# Patient Record
Sex: Male | Born: 1995
Health system: Southern US, Community
[De-identification: ages and names within clinical notes are randomized; demographics above are authoritative.]

## PROBLEM LIST (undated history)

## (undated) DIAGNOSIS — N289 Disorder of kidney and ureter, unspecified: Secondary | ICD-10-CM

---
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
## 2013-02-18 ENCOUNTER — Emergency Department (HOSPITAL_COMMUNITY)
Admission: EM | Admit: 2013-02-18 | Discharge: 2013-02-18 | Disposition: A | Discharge status: Home / Self Care | Payer: Self-pay | Attending: Emergency Medicine | Admitting: Emergency Medicine

## 2013-02-18 ENCOUNTER — Encounter (HOSPITAL_COMMUNITY): Payer: Self-pay | Admitting: Emergency Medicine

## 2013-02-18 DIAGNOSIS — R112 Nausea with vomiting, unspecified: Secondary | ICD-10-CM | POA: Insufficient documentation

## 2013-02-18 DIAGNOSIS — N200 Calculus of kidney: Secondary | ICD-10-CM | POA: Insufficient documentation

## 2013-02-18 HISTORY — DX: Disorder of kidney and ureter, unspecified: N28.9

## 2013-02-18 LAB — URINALYSIS, ROUTINE W REFLEX MICROSCOPIC
Glucose, UA: NEGATIVE mg/dL
Nitrite: NEGATIVE
Specific Gravity, Urine: 1.025 (ref 1.005–1.030)
pH: 6.5 (ref 5.0–8.0)

## 2013-02-18 LAB — CBC WITH DIFFERENTIAL/PLATELET
Eosinophils Relative: 0 % (ref 0–5)
Hemoglobin: 15.4 g/dL (ref 13.0–17.0)
Lymphocytes Relative: 6 % — ABNORMAL LOW (ref 12–46)
Lymphs Abs: 0.9 10*3/uL (ref 0.7–4.0)
MCV: 89.3 fL (ref 78.0–100.0)
Monocytes Relative: 5 % (ref 3–12)
Neutrophils Relative %: 89 % — ABNORMAL HIGH (ref 43–77)
Platelets: 180 10*3/uL (ref 150–400)
RBC: 4.87 MIL/uL (ref 4.22–5.81)
WBC: 16.4 10*3/uL — ABNORMAL HIGH (ref 4.0–10.5)

## 2013-02-18 LAB — BASIC METABOLIC PANEL
CO2: 27 mEq/L (ref 19–32)
Glucose, Bld: 97 mg/dL (ref 70–99)
Potassium: 3.9 mEq/L (ref 3.5–5.1)
Sodium: 136 mEq/L (ref 135–145)

## 2013-02-18 MED ORDER — KETOROLAC TROMETHAMINE 30 MG/ML IJ SOLN
30.0000 mg | Freq: Once | INTRAMUSCULAR | Status: AC
  Administered 2013-02-18: 30 mg via INTRAVENOUS
  Filled 2013-02-18: qty 1

## 2013-02-18 MED ORDER — HYDROCODONE-ACETAMINOPHEN 5-325 MG PO TABS: 1.0000 | ORAL_TABLET | ORAL | Status: DC | PRN

## 2013-02-18 MED ORDER — ONDANSETRON HCL 4 MG/2ML IJ SOLN
4.0000 mg | Freq: Once | INTRAMUSCULAR | Status: AC
  Administered 2013-02-18: 4 mg via INTRAVENOUS
  Filled 2013-02-18: qty 2

## 2013-02-18 MED ORDER — HYDROMORPHONE HCL PF 1 MG/ML IJ SOLN
0.5000 mg | Freq: Once | INTRAMUSCULAR | Status: AC
  Administered 2013-02-18: 0.5 mg via INTRAVENOUS
  Filled 2013-02-18: qty 1

## 2013-02-18 MED ORDER — SODIUM CHLORIDE 0.9 % IV SOLN
INTRAVENOUS | Status: DC
  Administered 2013-02-18: 13:00:00 via INTRAVENOUS

## 2013-02-18 MED ORDER — PROMETHAZINE HCL 12.5 MG PO TABS: 12.5000 mg | ORAL_TABLET | Freq: Four times a day (QID) | ORAL | Status: DC | PRN

## 2013-02-18 NOTE — ED Provider Notes (Signed)
CSN: [id number]     Arrival date & time 02/18/13  1201 History   First MD Initiated Contact with Patient 02/18/13 1206     Chief Complaint  Patient presents with  . Flank Pain   (Consider location/radiation/quality/duration/timing/severity/associated sxs/prior Treatment) Patient is a 17 y.o. male presenting with flank pain. The history is provided by the patient.  Flank Pain This is a new problem. The current episode started yesterday. The problem occurs constantly. The problem has been gradually worsening. Associated symptoms include abdominal pain, nausea and vomiting. Pertinent negatives include no chest pain, chills, coughing, fever, headaches or rash. Nothing aggravates the symptoms. He has tried nothing for the symptoms.   NAVON KOTOWSKI is a 17 y.o. male who presents to the ED with right flank pain that started last night. History of kidney stones and this feels the same. He did have lithotripsy in the past x 1.  Last CT scan showed several stones bilateral. Pain at it's worst has been 9/10 and currently 7/10. He has taken nothing for pain.  Past Medical History  Diagnosis Date  . Renal disorder     kidney stones   History reviewed. No pertinent past surgical history. No family history on file. History  Substance Use Topics  . Smoking status: Never Smoker   . Smokeless tobacco: Not on file  . Alcohol Use: No    Review of Systems  Constitutional: Negative for fever and chills.  HENT: Negative.   Eyes: Negative for visual disturbance.  Respiratory: Negative for cough and shortness of breath.   Cardiovascular: Negative for chest pain.  Gastrointestinal: Positive for nausea, vomiting and abdominal pain.  Genitourinary: Positive for flank pain. Negative for dysuria and urgency.  Musculoskeletal: Positive for back pain.  Skin: Negative for rash.  Neurological: Negative for dizziness and headaches.  Psychiatric/Behavioral: The patient is not nervous/anxious.     Allergies   Sulfa antibiotics  Home Medications   Current Outpatient Rx  Name  Route  Sig  Dispense  Refill  . HYDROcodone-acetaminophen (NORCO/VICODIN) 5-325 MG per tablet   Oral   Take 1 tablet by mouth every 4 (four) hours as needed.   20 tablet   0   . promethazine (PHENERGAN) 12.5 MG tablet   Oral   Take 1 tablet (12.5 mg total) by mouth every 6 (six) hours as needed for nausea or vomiting.   30 tablet   0    BP 134/84  Pulse 72  Temp(Src) 97.5 F (36.4 C) (Oral)  Resp 18  Ht 5\' 5"  (1.651 m)  Wt 109 lb (49.442 kg)  BMI 18.14 kg/m2  SpO2 100% Physical Exam  Nursing note and vitals reviewed. Constitutional: He is oriented to person, place, and time. He appears well-developed and well-nourished.  HENT:  Head: Normocephalic and atraumatic.  Eyes: EOM are normal.  Neck: Neck supple.  Cardiovascular: Normal rate and regular rhythm.   Pulmonary/Chest: Effort normal and breath sounds normal.  Abdominal: Soft. There is tenderness in the right lower quadrant. There is no rigidity, no rebound and no guarding.  Right flank pain with palpation.  Musculoskeletal: Normal range of motion.  Neurological: He is alert and oriented to person, place, and time. No cranial nerve deficit.  Skin: Skin is warm and dry.  Psychiatric: He has a normal mood and affect. His behavior is normal.   Results for orders placed during the hospital encounter of 02/18/13 (from the past 24 hour(s))  URINALYSIS, ROUTINE W REFLEX MICROSCOPIC  Status: Abnormal   Collection Time    02/18/13 12:22 PM      Result Value Range   Color, Urine AMBER (*) YELLOW   APPearance HAZY (*) CLEAR   Specific Gravity, Urine 1.025  1.005 - 1.030   pH 6.5  5.0 - 8.0   Glucose, UA NEGATIVE  NEGATIVE mg/dL   Hgb urine dipstick LARGE (*) NEGATIVE   Bilirubin Urine SMALL (*) NEGATIVE   Ketones, ur NEGATIVE  NEGATIVE mg/dL   Protein, ur 308 (*) NEGATIVE mg/dL   Urobilinogen, UA 0.2  0.0 - 1.0 mg/dL   Nitrite NEGATIVE  NEGATIVE    Leukocytes, UA NEGATIVE  NEGATIVE  URINE MICROSCOPIC-ADD ON     Status: None   Collection Time    02/18/13 12:22 PM      Result Value Range   RBC / HPF TOO NUMEROUS TO COUNT  <3 RBC/hpf  CBC WITH DIFFERENTIAL     Status: Abnormal   Collection Time    02/18/13  1:34 PM      Result Value Range   WBC 16.4 (*) 4.0 - 10.5 K/uL   RBC 4.87  4.22 - 5.81 MIL/uL   Hemoglobin 15.4  13.0 - 17.0 g/dL   HCT 65.7  84.6 - 96.2 %   MCV 89.3  78.0 - 100.0 fL   MCH 31.6  26.0 - 34.0 pg   MCHC 35.4  30.0 - 36.0 g/dL   RDW 95.2  84.1 - 32.4 %   Platelets 180  150 - 400 K/uL   Neutrophils Relative % 89 (*) 43 - 77 %   Neutro Abs 14.5 (*) 1.7 - 7.7 K/uL   Lymphocytes Relative 6 (*) 12 - 46 %   Lymphs Abs 0.9  0.7 - 4.0 K/uL   Monocytes Relative 5  3 - 12 %   Monocytes Absolute 0.9  0.1 - 1.0 K/uL   Eosinophils Relative 0  0 - 5 %   Eosinophils Absolute 0.0  0.0 - 0.7 K/uL   Basophils Relative 0  0 - 1 %   Basophils Absolute 0.0  0.0 - 0.1 K/uL  BASIC METABOLIC PANEL     Status: None   Collection Time    02/18/13  1:34 PM      Result Value Range   Sodium 136  135 - 145 mEq/L   Potassium 3.9  3.5 - 5.1 mEq/L   Chloride 100  96 - 112 mEq/L   CO2 27  19 - 32 mEq/L   Glucose, Bld 97  70 - 99 mg/dL   BUN 9  6 - 23 mg/dL   Creatinine, Ser 4.01  0.50 - 1.35 mg/dL   Calcium 02.7  8.4 - 25.3 mg/dL   GFR calc non Af Amer >90  >90 mL/min   GFR calc Af Amer >90  >90 mL/min    ED Course  Procedures Re examined after IV hydration, Zofran and Toradol. Patient feeling better pain 4/10, no nausea. Abdomen soft, minimal tenderness RLQ and right flank.   MDM: Dr. Bebe Shaggy in to see the patient.   17 y.o. male with history of kidney stones with right flank pain, nausea and vomiting since last night. Will d/c home with urine strainer,medication for nausea and pain. Patient voided just prior to discharge and passed two stones at that time. Stable for discharge and will follow up with urology.  I have  reviewed this patient's vital signs, nurses notes, appropriate labs and discussed findings and plan of  care with the patient. He voices understanding.    Medication List         HYDROcodone-acetaminophen 5-325 MG per tablet  Commonly known as:  NORCO/VICODIN  Take 1 tablet by mouth every 4 (four) hours as needed.     promethazine 12.5 MG tablet  Commonly known as:  PHENERGAN  Take 1 tablet (12.5 mg total) by mouth every 6 (six) hours as needed for nausea or vomiting.           Janne Napoleon, NP 02/18/13 1415

## 2013-02-18 NOTE — ED Provider Notes (Signed)
Patient seen/examined in the Emergency Department in conjunction with Midlevel Provider neese Patient reports right flank pain Exam : awake/alert, no distress Plan: check labs, treat pain and likely stable for d/c with urology followup   Joya Gaskins, MD 02/18/13 1327

## 2013-02-18 NOTE — ED Provider Notes (Signed)
Medical screening examination/treatment/procedure(s) were conducted as a shared visit with non-physician practitioner(s) and myself.  I personally evaluated the patient during the encounter.  EKG Interpretation   None         Joya Gaskins, MD 02/18/13 1701

## 2013-02-18 NOTE — ED Notes (Signed)
Pt c/o pain in r flank since last night.  Reports pain and vomiting today.  Pt has history of kidney stones.

## 2013-05-27 ENCOUNTER — Emergency Department (HOSPITAL_COMMUNITY)
Admission: EM | Admit: 2013-05-27 | Discharge: 2013-05-27 | Disposition: A | Discharge status: Home / Self Care | Payer: Self-pay | Attending: Emergency Medicine | Admitting: Emergency Medicine

## 2013-05-27 ENCOUNTER — Encounter (HOSPITAL_COMMUNITY): Payer: Self-pay | Admitting: Emergency Medicine

## 2013-05-27 DIAGNOSIS — Z87442 Personal history of urinary calculi: Secondary | ICD-10-CM | POA: Insufficient documentation

## 2013-05-27 DIAGNOSIS — H6120 Impacted cerumen, unspecified ear: Secondary | ICD-10-CM

## 2013-05-27 DIAGNOSIS — J029 Acute pharyngitis, unspecified: Secondary | ICD-10-CM | POA: Insufficient documentation

## 2013-05-27 DIAGNOSIS — H6690 Otitis media, unspecified, unspecified ear: Secondary | ICD-10-CM

## 2013-05-27 MED ORDER — AMOXICILLIN 500 MG PO CAPS: 500.0000 mg | ORAL_CAPSULE | Freq: Three times a day (TID) | ORAL | Status: DC

## 2013-05-27 NOTE — ED Provider Notes (Signed)
Medical screening examination/treatment/procedure(s) were performed by non-physician practitioner and as supervising physician I was immediately available for consultation/collaboration.   EKG Interpretation None        , MD 05/27/13 1025 

## 2013-05-27 NOTE — Discharge Instructions (Signed)
Cerumen Impaction °A cerumen impaction is when the wax in your ear forms a plug. This plug usually causes reduced hearing. Sometimes it also causes an earache or dizziness. Removing a cerumen impaction can be difficult and painful. The wax sticks to the ear canal. The canal is sensitive and bleeds easily. If you try to remove a heavy wax buildup with a cotton tipped swab, you may push it in further. °Irrigation with water, suction, and small ear curettes may be used to clear out the wax. If the impaction is fixed to the skin in the ear canal, ear drops may be needed for a few days to loosen the wax. People who build up a lot of wax frequently can use ear wax removal products available in your local drugstore. °SEEK MEDICAL CARE IF:  °You develop an earache, increased hearing loss, or marked dizziness. °Document Released: 04/18/2004 Document Revised: 06/03/2011 Document Reviewed: 06/08/2009 °[employer]® Patient Information ©2014 [employer]. ° °Otitis Media, Adult °Otitis media is redness, soreness, and swelling (inflammation) of the middle ear. Otitis media may be caused by allergies or, most commonly, by infection. Often it occurs as a complication of the common cold. °SIGNS AND SYMPTOMS °Symptoms of otitis media may include: °· Earache. °· Fever. °· Ringing in your ear. °· Headache. °· Leakage of fluid from the ear. °DIAGNOSIS °To diagnose otitis media, your health care provider will examine your ear with an otoscope. This is an instrument that allows your health care provider to see into your ear in order to examine your eardrum. Your health care provider also will ask you questions about your symptoms. °TREATMENT  °Typically, otitis media resolves on its own within 3 5 days. Your health care provider may prescribe medicine to ease your symptoms of pain. If otitis media does not resolve within 5 days or is recurrent, your health care provider may prescribe antibiotic medicines if he or she suspects that a  bacterial infection is the cause. °HOME CARE INSTRUCTIONS  °· Take your medicine as directed until it is gone, even if you feel better after the first few days. °· Only take over-the-counter or prescription medicines for pain, discomfort, or fever as directed by your health care provider. °· Follow up with your health care provider as directed. °SEEK MEDICAL CARE IF: °· You have otitis media only in one ear or bleeding from your nose or both. °· You notice a lump on your neck. °· You are not getting better in 3 5 days. °· You feel worse instead of better. °SEEK IMMEDIATE MEDICAL CARE IF:  °· You have pain that is not controlled with medicine. °· You have swelling, redness, or pain around your ear or stiffness in your neck. °· You notice that part of your face is paralyzed. °· You notice that the bone behind your ear (mastoid) is tender when you touch it. °MAKE SURE YOU:  °· Understand these instructions. °· Will watch your condition. °· Will get help right away if you are not doing well or get worse. °Document Released: 12/15/2003 Document Revised: 12/30/2012 Document Reviewed: 10/06/2012 °[employer]® Patient Information ©2014 [employer]. ° °

## 2013-05-27 NOTE — ED Provider Notes (Signed)
CSN: [id number]     Arrival date & time 05/27/13  0751 History   First MD Initiated Contact with Patient 05/27/13 [phone]     Chief Complaint  Patient presents with  . Otalgia     (Consider location/radiation/quality/duration/timing/severity/associated sxs/prior Treatment) HPI Comments: Russell Reynolds is a 18 y.o. Male presenting with bilateral ear pressure since last night. He denies pain, stating he just has pressure, with the right being worse than the left and notices decreased hearing acuity.  There has been no drainage from the ears and he denies trauma.  He has had uri symptoms for the past week including nasal congestion and clear nasal drainage.  He denies sore throat and fever and denies any other complaints.  He has found no alleviators and nothing makes his symptoms worse.  The history is provided by the patient.    Past Medical History  Diagnosis Date  . Renal disorder     kidney stones   History reviewed. No pertinent past surgical history. No family history on file. History  Substance Use Topics  . Smoking status: Never Smoker   . Smokeless tobacco: Not on file  . Alcohol Use: No    Review of Systems  Constitutional: Negative for fever and chills.  HENT: Positive for congestion, hearing loss, rhinorrhea and sore throat. Negative for ear pain, sinus pressure, trouble swallowing and voice change.   Eyes: Negative for discharge.  Respiratory: Negative for cough, shortness of breath, wheezing and stridor.   Cardiovascular: Negative for chest pain.  Gastrointestinal: Negative for abdominal pain.  Genitourinary: Negative.       Allergies  Sulfa antibiotics  Home Medications   Current Outpatient Rx  Name  Route  Sig  Dispense  Refill  . ibuprofen (ADVIL,MOTRIN) 200 MG tablet   Oral   Take 400 mg by mouth every 6 (six) hours as needed for moderate pain.         Marland Kitchen. amoxicillin (AMOXIL) 500 MG capsule   Oral   Take 1 capsule (500 mg total) by mouth 3 (three)  times daily.   30 capsule   0    BP 140/93  Pulse 98  Temp(Src) 98.9 F (37.2 C) (Oral)  Resp 20  Ht 5\' 5"  (1.651 m)  Wt 110 lb (49.896 kg)  BMI 18.31 kg/m2  SpO2 99% Physical Exam  Constitutional: He is oriented to person, place, and time. He appears well-developed and well-nourished.  HENT:  Head: Normocephalic and atraumatic.  Right Ear: Ear canal normal. No drainage or tenderness. Decreased hearing is noted.  Left Ear: Tympanic membrane and ear canal normal. No tenderness. Tympanic membrane is not injected, not erythematous, not retracted and not bulging.  Nose: Mucosal edema and rhinorrhea present.  Mouth/Throat: Uvula is midline, oropharynx is clear and moist and mucous membranes are normal. No oropharyngeal exudate, posterior oropharyngeal edema, posterior oropharyngeal erythema or tonsillar abscesses.  Cerumen impaction right.  Eyes: Conjunctivae are normal.  Cardiovascular: Normal rate and normal heart sounds.   Pulmonary/Chest: Effort normal. No respiratory distress. He has no wheezes. He has no rales.  Abdominal: Soft. There is no tenderness.  Musculoskeletal: Normal range of motion.  Neurological: He is alert and oriented to person, place, and time.  Skin: Skin is warm and dry. No rash noted.  Psychiatric: He has a normal mood and affect.    ED Course  EAR CERUMEN REMOVAL Date/Time: 05/27/2013 9:00 AM Performed by: Burgess AmorIDOL,  Authorized by: Burgess AmorIDOL,  Consent: Verbal consent obtained. Risks and  benefits: risks, benefits and alternatives were discussed Consent given by: patient Patient identity confirmed: verbally with patient Local anesthetic: none Ceruminolytics applied: Ceruminolytics applied prior to the procedure. Location details: right ear Procedure type: curette and irrigation Comments: Copious dark wax flushed and curetted from the ear.  External canal with mild erythema on re-exam.  TM erythematous, loss of landmarks.   (including critical care  time)   Labs Review Labs Reviewed - No data to display Imaging Review No results found.   EKG Interpretation None      MDM   Final diagnoses:  Otitis media  Cerumen impaction    Hearing acuity normal at time of dc.  Amoxil prescribed for otitis media.  Encouraged avoiding using qtips which he currently uses to clean his ears. Prn f/u anticipated.    Burgess Amor, PA-C 05/27/13 [phone]

## 2013-05-27 NOTE — ED Notes (Signed)
Pt reports congestion for a week. Last night began to have pressure in bilateral ears. Denies fever.

## 2016-09-02 ENCOUNTER — Ambulatory Visit (INDEPENDENT_AMBULATORY_CARE_PROVIDER_SITE_OTHER): Payer: BLUE CROSS/BLUE SHIELD | Admitting: Otolaryngology

## 2016-09-02 DIAGNOSIS — H9011 Conductive hearing loss, unilateral, right ear, with unrestricted hearing on the contralateral side: Secondary | ICD-10-CM

## 2016-10-07 ENCOUNTER — Ambulatory Visit (INDEPENDENT_AMBULATORY_CARE_PROVIDER_SITE_OTHER): Payer: BLUE CROSS/BLUE SHIELD | Admitting: Otolaryngology

## 2016-10-07 DIAGNOSIS — H9011 Conductive hearing loss, unilateral, right ear, with unrestricted hearing on the contralateral side: Secondary | ICD-10-CM | POA: Diagnosis not present

## 2016-10-07 DIAGNOSIS — H6521 Chronic serous otitis media, right ear: Secondary | ICD-10-CM | POA: Diagnosis not present

## 2016-12-03 ENCOUNTER — Emergency Department (HOSPITAL_COMMUNITY)
Admission: EM | Admit: 2016-12-03 | Discharge: 2016-12-03 | Disposition: A | Discharge status: Home / Self Care | Payer: BLUE CROSS/BLUE SHIELD | Attending: Emergency Medicine | Admitting: Emergency Medicine

## 2016-12-03 ENCOUNTER — Encounter (HOSPITAL_COMMUNITY): Payer: Self-pay

## 2016-12-03 ENCOUNTER — Emergency Department (HOSPITAL_COMMUNITY): Payer: BLUE CROSS/BLUE SHIELD

## 2016-12-03 DIAGNOSIS — R310 Gross hematuria: Secondary | ICD-10-CM | POA: Insufficient documentation

## 2016-12-03 DIAGNOSIS — R319 Hematuria, unspecified: Secondary | ICD-10-CM | POA: Diagnosis present

## 2016-12-03 DIAGNOSIS — N200 Calculus of kidney: Secondary | ICD-10-CM | POA: Diagnosis not present

## 2016-12-03 LAB — URINALYSIS, ROUTINE W REFLEX MICROSCOPIC
Bacteria, UA: NONE SEEN
Bilirubin Urine: NEGATIVE
GLUCOSE, UA: NEGATIVE mg/dL
KETONES UR: NEGATIVE mg/dL
NITRITE: NEGATIVE
PH: 7 (ref 5.0–8.0)
PROTEIN: 100 mg/dL — AB
Specific Gravity, Urine: 1.014 (ref 1.005–1.030)
Squamous Epithelial / LPF: NONE SEEN

## 2016-12-03 LAB — BASIC METABOLIC PANEL
Anion gap: 9 (ref 5–15)
BUN: 13 mg/dL (ref 6–20)
CALCIUM: 9.2 mg/dL (ref 8.9–10.3)
CO2: 24 mmol/L (ref 22–32)
Chloride: 102 mmol/L (ref 101–111)
Creatinine, Ser: 0.76 mg/dL (ref 0.61–1.24)
GFR calc Af Amer: >60 mL/min (ref >60)
GLUCOSE: 92 mg/dL (ref 65–99)
Potassium: 3 mmol/L — ABNORMAL LOW (ref 3.5–5.1)
Sodium: 135 mmol/L (ref 135–145)

## 2016-12-03 LAB — CBC WITH DIFFERENTIAL/PLATELET
BASOS ABS: 0 10*3/uL (ref 0.0–0.1)
BASOS PCT: 0 %
EOS PCT: 1 %
Eosinophils Absolute: 0.1 10*3/uL (ref 0.0–0.7)
HEMATOCRIT: 43.1 % (ref 39.0–52.0)
Hemoglobin: 15.2 g/dL (ref 13.0–17.0)
LYMPHS PCT: 37 %
Lymphs Abs: 2.8 10*3/uL (ref 0.7–4.0)
MCH: 31.4 pg (ref 26.0–34.0)
MCHC: 35.3 g/dL (ref 30.0–36.0)
MCV: 89 fL (ref 78.0–100.0)
MONO ABS: 0.4 10*3/uL (ref 0.1–1.0)
MONOS PCT: 6 %
NEUTROS ABS: 4.2 10*3/uL (ref 1.7–7.7)
Neutrophils Relative %: 56 %
PLATELETS: 175 10*3/uL (ref 150–400)
RBC: 4.84 MIL/uL (ref 4.22–5.81)
RDW: 12.3 % (ref 11.5–15.5)
WBC: 7.6 10*3/uL (ref 4.0–10.5)

## 2016-12-03 LAB — CK: Total CK: 87 U/L (ref 49–397)

## 2016-12-03 MED ORDER — SODIUM CHLORIDE 0.9 % IV BOLUS (SEPSIS)
1000.0000 mL | Freq: Once | INTRAVENOUS | Status: AC
  Administered 2016-12-03: 1000 mL via INTRAVENOUS

## 2016-12-03 MED ORDER — ONDANSETRON 4 MG PO TBDP: 4.0000 mg | ORAL_TABLET | Freq: Three times a day (TID) | ORAL | 0 refills | Status: DC | PRN

## 2016-12-03 MED ORDER — IBUPROFEN 800 MG PO TABS: 800.0000 mg | ORAL_TABLET | Freq: Three times a day (TID) | ORAL | 0 refills | Status: DC | PRN

## 2016-12-03 NOTE — ED Triage Notes (Signed)
Reports of blood in urine approx. 30 minutes ago. Has no other complaints.

## 2016-12-03 NOTE — ED Provider Notes (Signed)
Emergency Department Provider Note   I have reviewed the triage vital signs and the nursing notes.   HISTORY  Chief Complaint Hematuria   HPI [name] is a 21 y.o. male with PMH of kidney stones presents emergency department for evaluation of sudden onset, asymptomatic hematuria. Symptoms began approximately one hour prior to ED presentation. He had the urge to urinate and then noticed dark red discoloration of the urine. He denies any associated urethral pain or discharge. Denies flank pain or abdominal discomfort. His only medication is Paxil which she is been on for a year with no complications. 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 He drinks water regularly. He does have a past history of complicated kidney stones but almost always has pain with these episodes. No vomiting or diarrhea. No chest pain or difficulty breathing.  Past Medical History:  Diagnosis Date  . Renal disorder    kidney stones    There are no active problems to display for this patient.   History reviewed. No pertinent surgical history.  Current Outpatient Rx  . Order #: [id number] Class: Print  . Order #: [id number] Class: Print  . Order #: [id number] Class: Historical Med    Allergies Sulfa antibiotics  No family history on file.  Social History Social History  Substance Use Topics  . Smoking status: Never Smoker  . Smokeless tobacco: Never Used  . Alcohol use No    Review of Systems  Constitutional: No fever/chills Eyes: No visual changes. ENT: No sore throat. Cardiovascular: Denies chest pain. Respiratory: Denies shortness of breath. Gastrointestinal: No abdominal pain.  No nausea, no vomiting.  No diarrhea.  No constipation. Genitourinary: Negative for dysuria. Positive hematuria.  Musculoskeletal: Negative for back pain. Skin: Negative for rash. Neurological: Negative for headaches, focal weakness or numbness.  10-point ROS  otherwise negative.  ____________________________________________   PHYSICAL EXAM:  VITAL SIGNS: ED Triage Vitals  Enc Vitals Group     BP 12/03/16 1953 (!) 141/96     Pulse Rate 12/03/16 1953 80     Resp 12/03/16 1953 18     Temp 12/03/16 1953 98.3 F (36.8 C)     Temp Source 12/03/16 1953 Oral     SpO2 12/03/16 1953 100 %     Weight 12/03/16 1954 120 lb (54.4 kg)     Height 12/03/16 1954 5\' 4"  (1.626 m)     Pain Score 12/03/16 1951 0   Constitutional: Alert and oriented. Well appearing and in no acute distress. Eyes: Conjunctivae are normal.  Head: Atraumatic. Nose: No congestion/rhinnorhea. Mouth/Throat: Mucous membranes are moist.  Oropharynx non-erythematous. Neck: No stridor.   Cardiovascular: Normal rate, regular rhythm. Good peripheral circulation. Grossly normal heart sounds.   Respiratory: Normal respiratory effort.  No retractions. Lungs CTAB. Gastrointestinal: Soft and nontender. No distention.  Musculoskeletal: No lower extremity tenderness nor edema. No gross deformities of extremities. Neurologic:  Normal speech and language. No gross focal neurologic deficits are appreciated.  Skin:  Skin is warm, dry and intact. No rash noted.  ____________________________________________   LABS (all labs ordered are listed, but only abnormal results are displayed)  Labs Reviewed  URINALYSIS, ROUTINE W REFLEX MICROSCOPIC - Abnormal; Notable for the following:       Result Value   Color, Urine AMBER (*)    APPearance CLOUDY (*)    Hgb urine dipstick LARGE (*)    Protein, ur 100 (*)    Leukocytes, UA SMALL (*)  All other components within normal limits  BASIC METABOLIC PANEL - Abnormal; Notable for the following:    Potassium 3.0 (*)    All other components within normal limits  CBC WITH DIFFERENTIAL/PLATELET  CK   ____________________________________________  RADIOLOGY  Ct Renal Stone Study  Result Date: 12/03/2016 CLINICAL DATA:  Initial evaluation for  acute hematuria. EXAM: CT ABDOMEN AND PELVIS WITHOUT CONTRAST TECHNIQUE: Multidetector CT imaging of the abdomen and pelvis was performed following the standard protocol without IV contrast. COMPARISON:  Prior CT from 08/19/2007. FINDINGS: Lower chest: Visualized lung bases are clear. Hepatobiliary: Limited noncontrast evaluation of the liver is unremarkable. Gallbladder within normal limits. No biliary dilatation. Pancreas: Pancreas within normal limits. Spleen: Spleen within normal limits. Adrenals/Urinary Tract: Adrenal glands are normal. Kidneys equal in size. Scattered cyst present bilaterally, largest of which position within the interpolar right kidney and measures 3.6 cm. Bilateral nonobstructive nephrolithiasis. Largest on the right positioned within the lower pole and measures 16 mm. Largest on the left also present in the lower pole and measures 4 mm. No radiopaque calculi seen along the expected course of the right renal collecting system. No right-sided hydronephrosis or hydroureter. On the left, there is a 3 mm stone within the mid left ureter ear with mild left hydroureter without significant hydronephrosis. No other calculi seen within the left ureter. There is a 5 mm stone positioned within the left posterior bladder lumen just beyond the left UVJ. Bladder otherwise unremarkable. Stomach/Bowel: Stomach within normal limits. No evidence for bowel obstruction. Appendix normal. No acute inflammatory changes seen about the bowels. Vascular/Lymphatic: Intra-abdominal aorta of normal caliber. No adenopathy. Reproductive: Prostate within normal limits. Other: No free air or fluid. Musculoskeletal: No acute osseus abnormality. No worrisome lytic or blastic osseous lesions. IMPRESSION: 1. 3 mm stone lodged within the mid left ureter with secondary mild left hydroureter without significant hydronephrosis. 2. Additional 5 mm stone positioned within the left posterior bladder lumen just beyond the left UVJ. 3.  Additional bilateral nonobstructive nephrolithiasis as above. 4. No other acute intra-abdominal or pelvic process. Electronically Signed   By: Rise Mu M.D.   On: 12/03/2016 22:41    ____________________________________________   PROCEDURES  Procedure(s) performed:   Procedures  None ____________________________________________   INITIAL IMPRESSION / ASSESSMENT AND PLAN / ED COURSE  Pertinent labs & imaging results that were available during my care of the patient were reviewed by me and considered in my medical decision making (see chart for details).  Patient resents to the emergency department for evaluation of asymptomatic hematuria which began immediately prior to ED presentation. He does have a history complicated by prior kidney stones. No fevers or chills. No pain. Plan for labs, CK, IV fluids, and CT renal protocol for further evaluation.   10:50 PM Patient with two two kidney stones on CT. One 3 mm stone in the left ureter and a second 5 mm stone in the bladder. No other significant findings. Patient with anaphylaxis reaction with Sulfa so did not prescribe Flomax. Will f/u with Urology.   At this time, I do not feel there is any life-threatening condition present. I have reviewed and discussed all results (EKG, imaging, lab, urine as appropriate), exam findings with patient. I have reviewed nursing notes and appropriate previous records.  I feel the patient is safe to be discharged home without further emergent workup. Discussed usual and customary return precautions. Patient and family (if present) verbalize understanding and are comfortable with this plan.  Patient will  follow-up with their primary care provider. If they do not have a primary care provider, information for follow-up has been provided to them. All questions have been answered.  ____________________________________________  FINAL CLINICAL IMPRESSION(S) / ED DIAGNOSES  Final diagnoses:  Gross  hematuria  Kidney stones     MEDICATIONS GIVEN DURING THIS VISIT:  Medications  sodium chloride 0.9 % bolus 1,000 mL (0 mLs Intravenous Stopped 12/03/16 2251)     NEW OUTPATIENT MEDICATIONS STARTED DURING THIS VISIT:  Discharge Medication List as of 12/03/2016 10:58 PM    START taking these medications   Details  ibuprofen (ADVIL,MOTRIN) 800 MG tablet Take 1 tablet (800 mg total) by mouth every 8 (eight) hours as needed for moderate pain., Starting Tue 12/03/2016, Print    ondansetron (ZOFRAN ODT) 4 MG disintegrating tablet Take 1 tablet (4 mg total) by mouth every 8 (eight) hours as needed for nausea or vomiting., Starting Tue 12/03/2016, Print        Note:  This document was prepared using Dragon voice recognition software and may include unintentional dictation errors.  Alona Bene, MD Emergency Medicine    , Arlyss Repress, MD 12/04/16 [phone]

## 2016-12-03 NOTE — Discharge Instructions (Signed)
You have been seen in the Emergency Department (ED) today for pain that we believe based on your workup, is caused by kidney stones.  As we have discussed, please drink plenty of fluids.  Please make a follow up appointment with the physician(s) listed elsewhere in this documentation.  You may take pain medication as needed but ONLY as prescribed. We also recommend that you take over-the-counter ibuprofen regularly according to label instructions over the next 5 days.  Take it with meals to minimize stomach discomfort.  Please see your doctor as soon as possible as stones may take 1-3 weeks to pass and you may require additional care or medications.  Return to the Emergency Department (ED) or call your doctor if you have any worsening pain, fever, painful urination, are unable to urinate, or develop other symptoms that concern you.    Kidney Stones Kidney stones (urolithiasis) are deposits that form inside your kidneys. The intense pain is caused by the stone moving through the urinary tract. When the stone moves, the ureter goes into spasm around the stone. The stone is usually passed in the urine.  CAUSES  A disorder that makes certain neck glands produce too much parathyroid hormone (primary hyperparathyroidism). A buildup of uric acid crystals, similar to gout in your joints. Narrowing (stricture) of the ureter. A kidney obstruction present at birth (congenital obstruction). Previous surgery on the kidney or ureters. Numerous kidney infections. SYMPTOMS  Feeling sick to your stomach (nauseous). Throwing up (vomiting). Blood in the urine (hematuria). Pain that usually spreads (radiates) to the groin. Frequency or urgency of urination. DIAGNOSIS  Taking a history and physical exam. Blood or urine tests. CT scan. Occasionally, an examination of the inside of the urinary bladder (cystoscopy) is performed. TREATMENT  Observation. Increasing your fluid intake. Extracorporeal shock wave  lithotripsy--This is a noninvasive procedure that uses shock waves to break up kidney stones. Surgery may be needed if you have severe pain or persistent obstruction. There are various surgical procedures. Most of the procedures are performed with the use of small instruments. Only small incisions are needed to accommodate these instruments, so recovery time is minimized. The size, location, and chemical composition are all important variables that will determine the proper choice of action for you. Talk to your health care provider to better understand your situation so that you will minimize the risk of injury to yourself and your kidney.  HOME CARE INSTRUCTIONS  Drink enough water and fluids to keep your urine clear or pale yellow. This will help you to pass the stone or stone fragments. Strain all urine through the provided strainer. Keep all particulate matter and stones for your health care provider to see. The stone causing the pain may be as small as a grain of salt. It is very important to use the strainer each and every time you pass your urine. The collection of your stone will allow your health care provider to analyze it and verify that a stone has actually passed. The stone analysis will often identify what you can do to reduce the incidence of recurrences. Only take over-the-counter or prescription medicines for pain, discomfort, or fever as directed by your health care provider. Keep all follow-up visits as told by your health care provider. This is important. Get follow-up X-rays if required. The absence of pain does not always mean that the stone has passed. It may have only stopped moving. If the urine remains completely obstructed, it can cause loss of kidney function or  even complete destruction of the kidney. It is your responsibility to make sure X-rays and follow-ups are completed. Ultrasounds of the kidney can show blockages and the status of the kidney. Ultrasounds are not  associated with any radiation and can be performed easily in a matter of minutes. Make changes to your daily diet as told by your health care provider. You may be told to: Limit the amount of salt that you eat. Eat 5 or more servings of fruits and vegetables each day. Limit the amount of meat, poultry, fish, and eggs that you eat. Collect a 24-hour urine sample as told by your health care provider. You may need to collect another urine sample every 6-12 months. SEEK MEDICAL CARE IF: You experience pain that is progressive and unresponsive to any pain medicine you have been prescribed. SEEK IMMEDIATE MEDICAL CARE IF:  Pain cannot be controlled with the prescribed medicine. You have a fever or shaking chills. The severity or intensity of pain increases over 18 hours and is not relieved by pain medicine. You develop a new onset of abdominal pain. You feel faint or pass out. You are unable to urinate.   This information is not intended to replace advice given to you by your health care provider. Make sure you discuss any questions you have with your health care provider.   Document Released: 03/11/2005 Document Revised: 11/30/2014 Document Reviewed: 08/12/2012 Elsevier Interactive Patient Education [employer]

## 2018-10-13 ENCOUNTER — Other Ambulatory Visit: Payer: Self-pay

## 2018-10-13 ENCOUNTER — Ambulatory Visit (INDEPENDENT_AMBULATORY_CARE_PROVIDER_SITE_OTHER): Payer: Self-pay | Admitting: "Endocrinology

## 2018-10-13 ENCOUNTER — Encounter: Payer: Self-pay | Admitting: "Endocrinology

## 2018-10-13 VITALS — BP 127/72 | HR 72 | Ht 64.0 in | Wt 113.0 lb

## 2018-10-13 DIAGNOSIS — F17200 Nicotine dependence, unspecified, uncomplicated: Secondary | ICD-10-CM

## 2018-10-13 DIAGNOSIS — E0590 Thyrotoxicosis, unspecified without thyrotoxic crisis or storm: Secondary | ICD-10-CM

## 2018-10-13 NOTE — Progress Notes (Signed)
Endocrinology Consult Note                                            10/13/2018, 1:32 PM   Subjective:    Patient ID: Russell DysDustin C Reynolds, male    DOB: 10-31-1995, PCP Kirstie PeriShah, Ashish, MD   Past Medical History:  Diagnosis Date  . Renal disorder    kidney stones   History reviewed. No pertinent surgical history. Social History   Socioeconomic History  . Marital status: Married    Spouse name: Not on file  . Number of children: Not on file  . Years of education: Not on file  . Highest education level: Not on file  Occupational History  . Not on file  Social Needs  . Financial resource strain: Not on file  . Food insecurity    Worry: Not on file    Inability: Not on file  . Transportation needs    Medical: Not on file    Non-medical: Not on file  Tobacco Use  . Smoking status: Current Every Day Smoker  . Smokeless tobacco: Never Used  Substance and Sexual Activity  . Alcohol use: No  . Drug use: No  . Sexual activity: Not on file  Lifestyle  . Physical activity    Days per week: Not on file    Minutes per session: Not on file  . Stress: Not on file  Relationships  . Social Musicianconnections    Talks on phone: Not on file    Gets together: Not on file    Attends religious service: Not on file    Active member of club or organization: Not on file    Attends meetings of clubs or organizations: Not on file    Relationship status: Not on file  Other Topics Concern  . Not on file  Social History Narrative  . Not on file   Family History  Problem Relation Age of Onset  . Thyroid disease Mother    Outpatient Encounter Medications as of 10/13/2018  Medication Sig  . busPIRone (BUSPAR) 10 MG tablet Take 10 mg by mouth daily as needed.  Marland Kitchen. PARoxetine (PAXIL) 10 MG tablet Take 10 mg by mouth at bedtime.  . [DISCONTINUED] ibuprofen (ADVIL,MOTRIN) 800 MG tablet Take 1 tablet (800 mg total) by mouth every 8 (eight) hours as needed for moderate pain.  . [DISCONTINUED]  ondansetron (ZOFRAN ODT) 4 MG disintegrating tablet Take 1 tablet (4 mg total) by mouth every 8 (eight) hours as needed for nausea or vomiting.   No facility-administered encounter medications on file as of 10/13/2018.    ALLERGIES: Allergies  Allergen Reactions  . Sulfa Antibiotics Anaphylaxis and Swelling    VACCINATION STATUS:  There is no immunization history on file for this patient.  HPI Russell DysDustin C Reynolds is 23 y.o. male who presents today with a medical history as above. he is being seen in consultation for abnormal thyroid function tests requested by Kirstie PeriShah, Ashish, MD.  He did not complain of particular thyroid symptoms at this time.  He was found to have suppressed TSH on routine lab work.  He has family history of Hashimoto's thyroiditis with hypothyroidism in his mother.  He reports intermittent tremors, on and off palpitations.  She weighs 113 pounds, maximum in her weight was 120 pounds.  He denies heat intolerance.  Denies  any personal history of goiter nor family history of thyroid malignancy.   -He denies dysphagia, shortness of breath, nor voice change.  Review of Systems  Constitutional: no recent weight gain/loss, no fatigue, no subjective hyperthermia, no subjective hypothermia Eyes: no blurry vision, no xerophthalmia ENT: no sore throat, no nodules palpated in throat, no dysphagia/odynophagia, no hoarseness Cardiovascular: no Chest Pain, no Shortness of Breath, +palpitations, no leg swelling Respiratory: no cough, no shortness of breath Gastrointestinal: no Nausea/Vomiting/Diarhhea Musculoskeletal: no muscle/joint aches Skin: no rashes Neurological: + tremors, no numbness, no tingling, no dizziness Psychiatric: no depression, no anxiety  Objective:    BP 127/72   Pulse 72   Ht 5\' 4"  (1.626 m)   Wt 113 lb (51.3 kg)   BMI 19.40 kg/m   Wt Readings from Last 3 Encounters:  10/13/18 113 lb (51.3 kg)  12/03/16 120 lb (54.4 kg)  05/27/13 110 lb (49.9 kg) (<1 %, Z=  -2.53)*   * Growth percentiles are based on CDC (Boys, 2-20 Years) data.    Physical Exam  Constitutional:  + weight for height, not in acute distress, normal state of mind Eyes: PERRLA, EOMI, no exophthalmos ENT: moist mucous membranes, no gross thyromegaly, no gross cervical lymphadenopathy Cardiovascular: normal precordial activity, Regular Rate and Rhythm, no Murmur/Rubs/Gallops Respiratory:  adequate breathing efforts, no gross chest deformity, Clear to auscultation bilaterally Gastrointestinal: abdomen soft, Non -tender, No distension, Bowel Sounds present, no gross organomegaly Musculoskeletal: no gross deformities, strength intact in all four extremities Skin: moist, warm, no rashes Neurological: no tremor with outstretched hands, Deep tendon reflexes normal in bilateral lower extremities.  CMP ( most recent) CMP     Component Value Date/Time   NA 135 12/03/2016 2113   K 3.0 (L) 12/03/2016 2113   CL 102 12/03/2016 2113   CO2 24 12/03/2016 2113   GLUCOSE 92 12/03/2016 2113   BUN 13 12/03/2016 2113   CREATININE 0.76 12/03/2016 2113   CALCIUM 9.2 12/03/2016 2113   GFRNONAA >60 12/03/2016 2113   GFRAA >60 12/03/2016 2113     Jul 29, 2018 labs: TSH 0.190 (normal 0.45-4.5) Free T4 1.47-normal Free T3 3.8-normal  Assessment & Plan:   1. Subclinical hyperthyroidism 2. Current smoker  - Russell Reynolds  is being seen at a kind request of Monico Blitz, MD. - I have reviewed his available thyroid records and clinically evaluated the patient. - Based on these reviews, he has subclinical hyperthyroidism,  however,  there is not sufficient information to proceed with definitive treatment plan.  - he will need a repeat,  more complete thyroid function tests towards confirming the diagnosis.  Will be sent to lab today -he will return in 1 week to review his repeat labs.   If his  labs are suggestive of primary hyperthyroidism, he will be considered for thyroid uptake and scan to  confirm the diagnosis.  - I did not initiate any new prescriptions today.  He is extensively counseled for smoking cessation. - I advised him  to maintain close follow up with Monico Blitz, MD for primary care needs.   - Time spent with the patient: 45 minutes, of which >50% was spent in obtaining information about his symptoms, reviewing his previous labs/studies,  evaluations, and treatments, counseling him about his abnormal thyroid function test, smoking, and developing a plan to confirm the diagnosis and long term treatment based on the latest standards of care/guidelines.    Russell Reynolds participated in the discussions, expressed understanding, and  voiced agreement with the above plans.  All questions were answered to his satisfaction. he is encouraged to contact clinic should he have any questions or concerns prior to his return visit.  Follow up plan: Return in about 1 week (around 10/20/2018) for Labs Today- Non-Fasting Ok, Follow up with Pre-visit Labs.   Marquis LunchGebre , MD [facility] [address] [place], [place] [address] Phone: [phone]  Fax: [phone]     10/13/2018, 1:32 PM  This note was partially dictated with voice recognition software. Similar sounding words can be transcribed inadequately or may not  be corrected upon review.

## 2018-10-23 IMAGING — CT CT RENAL STONE PROTOCOL
2 of 4 series · 16 of 46 positions shown, 18 images · non-contrast
Comparison: Prior CT from 08/19/2007.

CLINICAL DATA: Initial evaluation for acute hematuria.

EXAM:
CT ABDOMEN AND PELVIS WITHOUT CONTRAST
TECHNIQUE: Multidetector CT imaging of the abdomen and pelvis was performed
following the standard protocol without IV contrast.

[Series 2: axial st · axial · 0.64mm/px · z∈[+432,+837]mm · 13 of 89 slices shown, 15 images]
[im 4/89  soft-tissue]
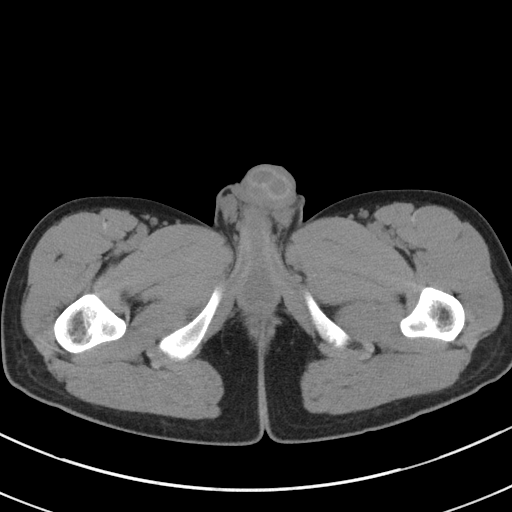
[im 4/89  bone]
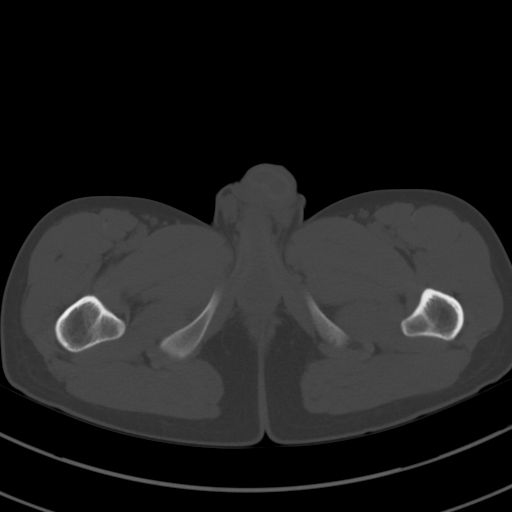
[im 12/89  soft-tissue]
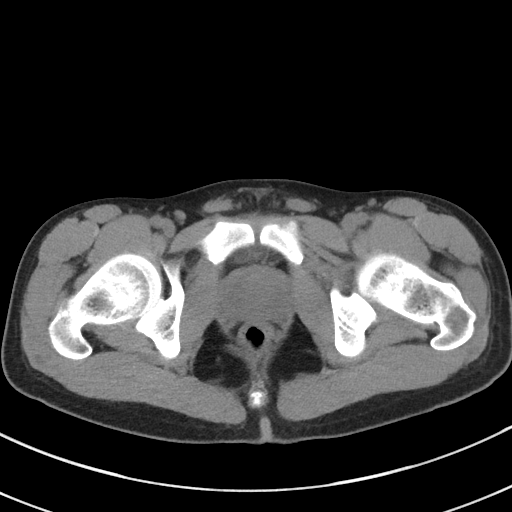
[im 19/89  soft-tissue]
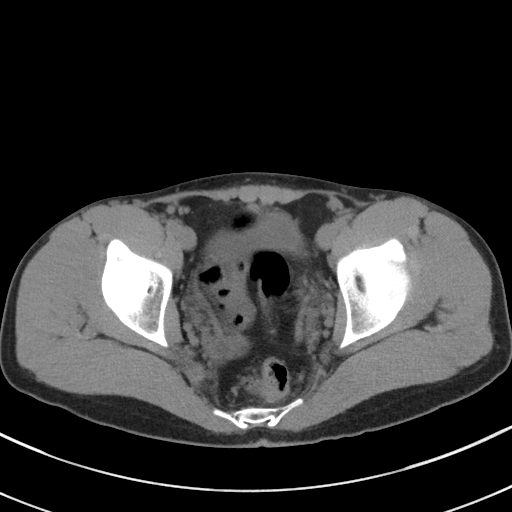
[im 26/89  soft-tissue]
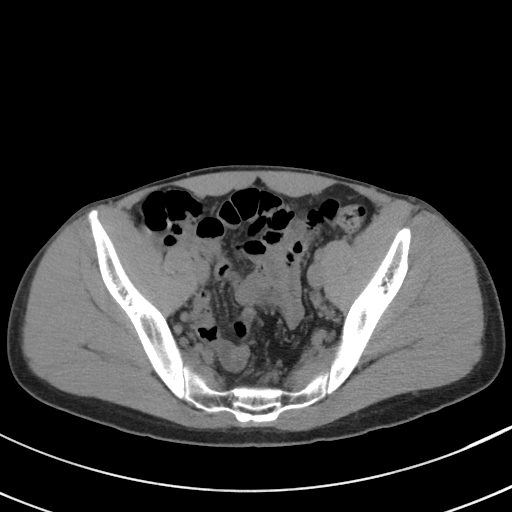
[im 30/89  soft-tissue]
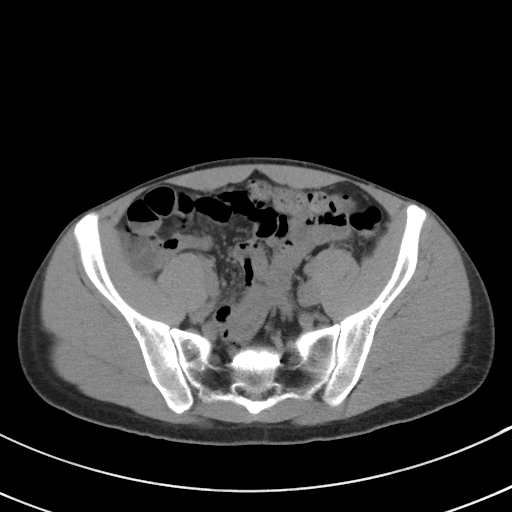
[im 37/89  soft-tissue]
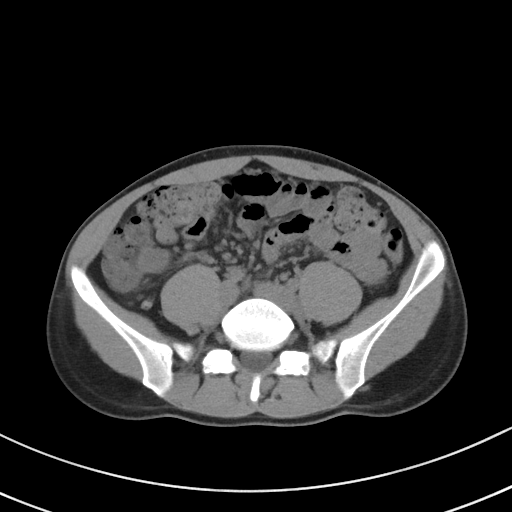
[im 45/89  soft-tissue]
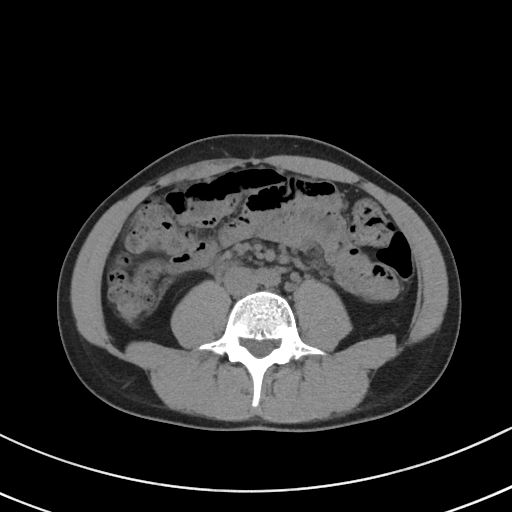
[im 52/89  soft-tissue]
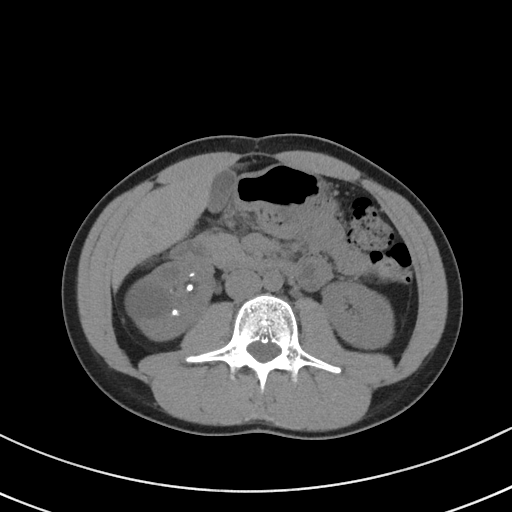
[im 59/89  soft-tissue]
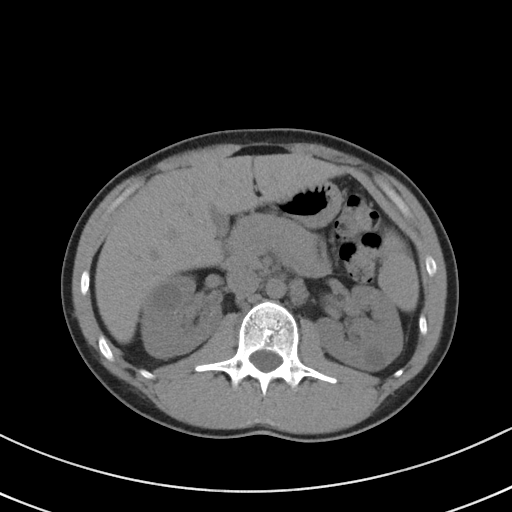
[im 59/89  bone]
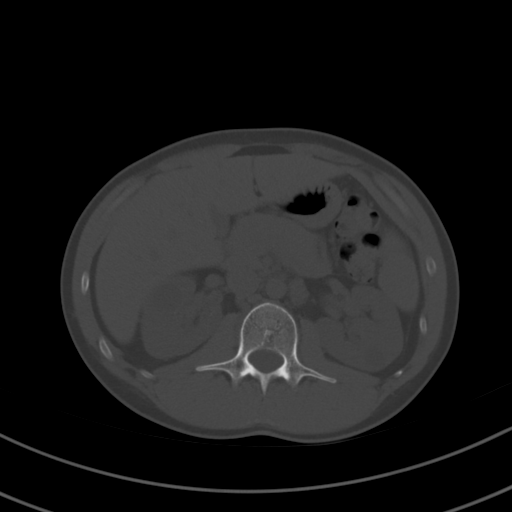
[im 63/89  soft-tissue]
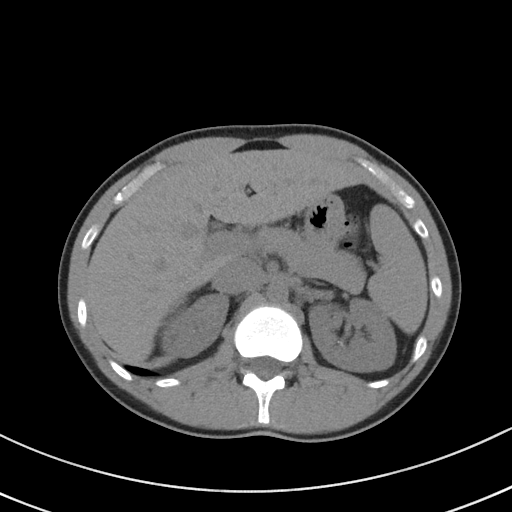
[im 70/89  soft-tissue]
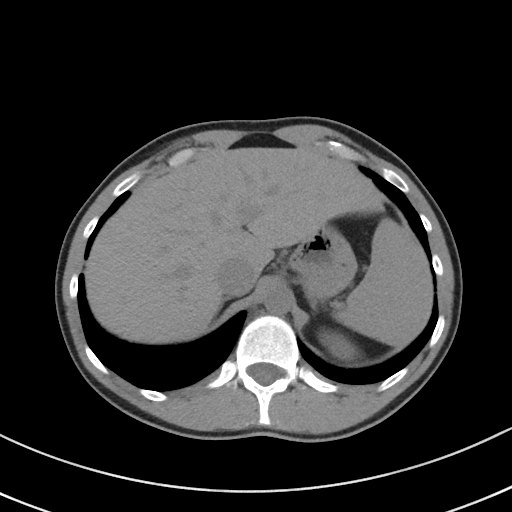
[im 78/89  soft-tissue]
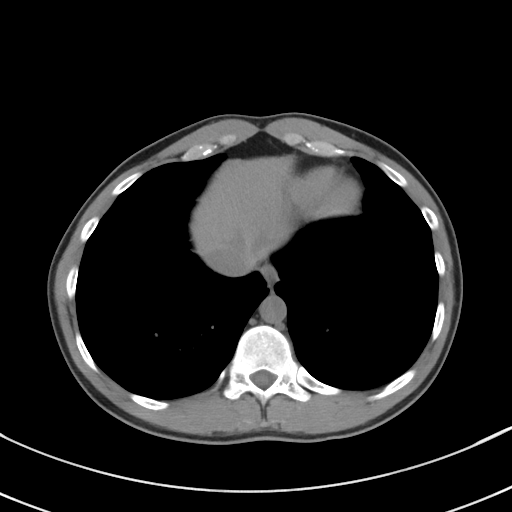
[im 85/89  soft-tissue]
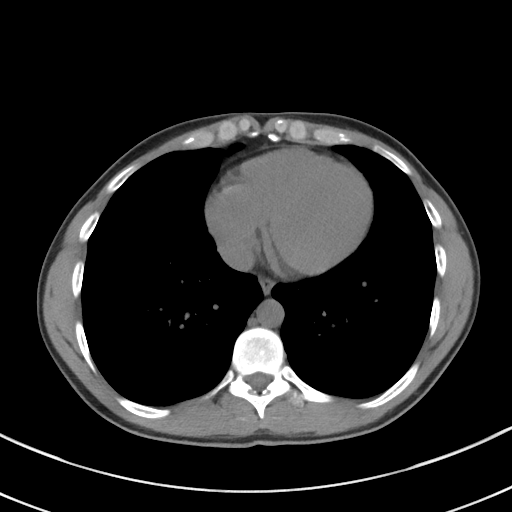

[Series 5: coronal st · coronal · 0.66mm/px · 3 of 66 slices shown]
[im 22/66  soft-tissue]
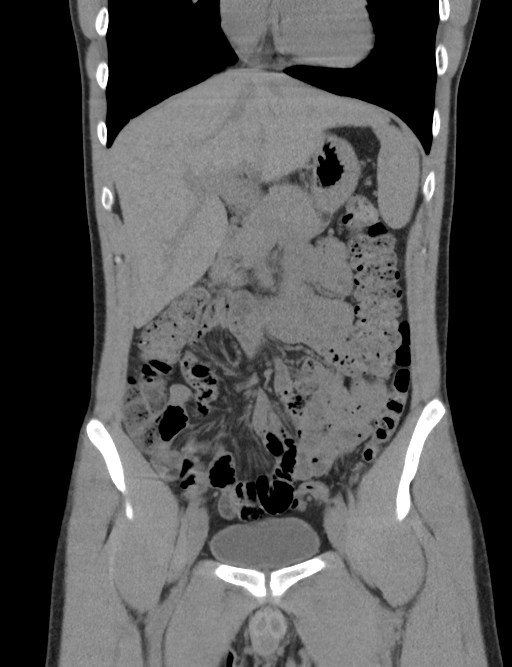
[im 29/66  soft-tissue]
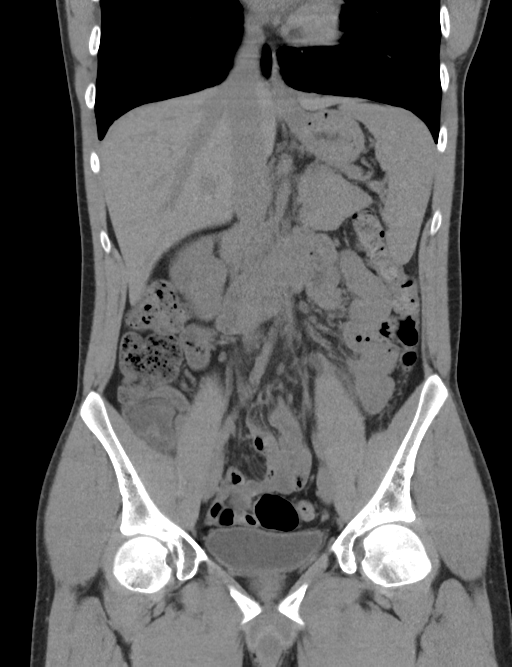
[im 37/66  soft-tissue]
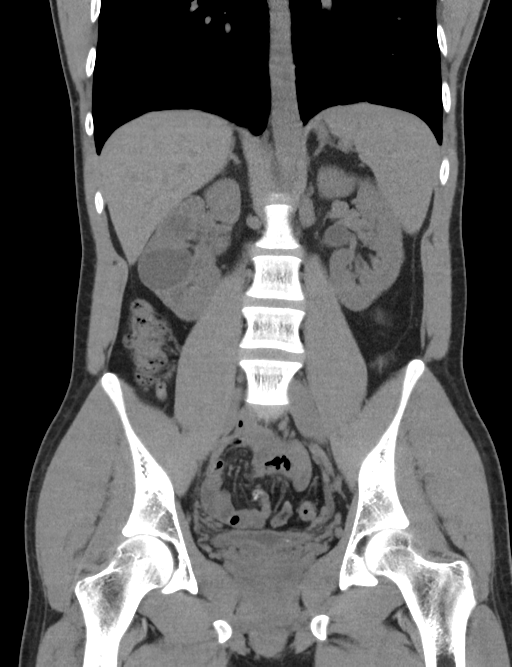

[16 of 46 positions shown; findings below may reference images not displayed]

FINDINGS: Lower chest: Visualized lung bases are clear.

Hepatobiliary: Limited noncontrast evaluation of the liver is
unremarkable. Gallbladder within normal limits. No biliary
dilatation.

Pancreas: Pancreas within normal limits.

Spleen: Spleen within normal limits.

Adrenals/Urinary Tract: Adrenal glands are normal.

Kidneys equal in size. Scattered cyst present bilaterally, largest
of which position within the interpolar right kidney and measures
3.6 cm. Bilateral nonobstructive nephrolithiasis. Largest on the
right positioned within the lower pole and measures 16 mm. Largest
on the left also present in the lower pole and measures 4 mm. No
radiopaque calculi seen along the expected course of the right renal
collecting system. No right-sided hydronephrosis or hydroureter.

On the left, there is a 3 mm stone within the mid left ureter ear
with mild left hydroureter without significant hydronephrosis. No
other calculi seen within the left ureter. There is a 5 mm stone
positioned within the left posterior bladder lumen just beyond the
left UVJ. Bladder otherwise unremarkable.

Stomach/Bowel: Stomach within normal limits. No evidence for bowel
obstruction. Appendix normal. No acute inflammatory changes seen
about the bowels.

Vascular/Lymphatic: Intra-abdominal aorta of normal caliber. No
adenopathy.

Reproductive: Prostate within normal limits.

Other: No free air or fluid.

Musculoskeletal: No acute osseus abnormality. No worrisome lytic or
blastic osseous lesions.
IMPRESSION: 1. 3 mm stone lodged within the mid left ureter with secondary mild
left hydroureter without significant hydronephrosis.
2. Additional 5 mm stone positioned within the left posterior
bladder lumen just beyond the left UVJ.
3. Additional bilateral nonobstructive nephrolithiasis as above.
4. No other acute intra-abdominal or pelvic process.

## 2018-10-27 ENCOUNTER — Ambulatory Visit: Payer: Self-pay | Admitting: "Endocrinology

## 2019-01-22 DIAGNOSIS — R002 Palpitations: Secondary | ICD-10-CM | POA: Diagnosis not present

## 2019-01-22 DIAGNOSIS — Z87891 Personal history of nicotine dependence: Secondary | ICD-10-CM | POA: Diagnosis not present

## 2019-01-22 DIAGNOSIS — E0590 Thyrotoxicosis, unspecified without thyrotoxic crisis or storm: Secondary | ICD-10-CM | POA: Diagnosis not present

## 2019-01-22 DIAGNOSIS — F419 Anxiety disorder, unspecified: Secondary | ICD-10-CM | POA: Diagnosis not present

## 2019-01-22 DIAGNOSIS — Z299 Encounter for prophylactic measures, unspecified: Secondary | ICD-10-CM | POA: Diagnosis not present

## 2019-01-22 DIAGNOSIS — Z681 Body mass index (BMI) 19 or less, adult: Secondary | ICD-10-CM | POA: Diagnosis not present

## 2019-01-29 DIAGNOSIS — R002 Palpitations: Secondary | ICD-10-CM | POA: Diagnosis not present

## 2019-02-24 DIAGNOSIS — R002 Palpitations: Secondary | ICD-10-CM | POA: Diagnosis not present

## 2019-02-26 DIAGNOSIS — Z299 Encounter for prophylactic measures, unspecified: Secondary | ICD-10-CM | POA: Diagnosis not present

## 2019-02-26 DIAGNOSIS — R002 Palpitations: Secondary | ICD-10-CM | POA: Diagnosis not present

## 2019-02-26 DIAGNOSIS — F419 Anxiety disorder, unspecified: Secondary | ICD-10-CM | POA: Diagnosis not present

## 2019-02-26 DIAGNOSIS — Z87891 Personal history of nicotine dependence: Secondary | ICD-10-CM | POA: Diagnosis not present

## 2019-02-26 DIAGNOSIS — Z681 Body mass index (BMI) 19 or less, adult: Secondary | ICD-10-CM | POA: Diagnosis not present

## 2019-02-26 DIAGNOSIS — E0590 Thyrotoxicosis, unspecified without thyrotoxic crisis or storm: Secondary | ICD-10-CM | POA: Diagnosis not present

## 2019-03-01 ENCOUNTER — Encounter: Payer: Self-pay | Admitting: Internal Medicine

## 2019-03-01 ENCOUNTER — Ambulatory Visit (INDEPENDENT_AMBULATORY_CARE_PROVIDER_SITE_OTHER): Payer: BC Managed Care – PPO | Admitting: Internal Medicine

## 2019-03-01 ENCOUNTER — Other Ambulatory Visit: Payer: Self-pay

## 2019-03-01 VITALS — BP 112/80 | HR 90 | Ht 64.0 in | Wt 113.0 lb

## 2019-03-01 DIAGNOSIS — E0590 Thyrotoxicosis, unspecified without thyrotoxic crisis or storm: Secondary | ICD-10-CM | POA: Diagnosis not present

## 2019-03-01 LAB — T4, FREE: Free T4: 0.97 ng/dL (ref 0.60–1.60)

## 2019-03-01 LAB — T3, FREE: T3, Free: 4.1 pg/mL (ref 2.3–4.2)

## 2019-03-01 LAB — TSH: TSH: 0.53 u[IU]/mL (ref 0.35–4.50)

## 2019-03-01 MED ORDER — ATENOLOL 25 MG PO TABS: 25.0000 mg | ORAL_TABLET | Freq: Every day | ORAL | 3 refills | Status: AC

## 2019-03-01 NOTE — Patient Instructions (Signed)
Please stop at the lab.  If your pulse is >90 at rest at home or you have bothersome palpitations, start: Atenolol 25 mg daily.  Please come back for a follow-up appointment in 3 months.   Hyperthyroidism  Hyperthyroidism is when the thyroid gland is too active (overactive). The thyroid gland is a small gland located in the lower front part of the neck, just in front of the windpipe (trachea). This gland makes hormones that help control how the body uses food for energy (metabolism) as well as how the heart and brain function. These hormones also play a role in keeping your bones strong. When the thyroid is overactive, it produces too much of a hormone called thyroxine. What are the causes? This condition may be caused by:  Graves' disease. This is a disorder in which the body's disease-fighting system (immune system) attacks the thyroid gland. This is the most common cause.  Inflammation of the thyroid gland.  A tumor in the thyroid gland.  Use of certain medicines, including: ? Prescription thyroid hormone replacement. ? Herbal supplements that mimic thyroid hormones. ? Amiodarone therapy.  Solid or fluid-filled lumps within your thyroid gland (thyroid nodules).  Taking in a large amount of iodine from foods or medicines. What increases the risk? You are more likely to develop this condition if:  You are male.  You have a family history of thyroid conditions.  You smoke tobacco.  You use a medicine called lithium.  You take medicines that affect the immune system (immunosuppressants). What are the signs or symptoms? Symptoms of this condition include:  Nervousness.  Inability to tolerate heat.  Unexplained weight loss.  Diarrhea.  Change in the texture of hair or skin.  Heart skipping beats or making extra beats.  Rapid heart rate.  Loss of menstruation.  Shaky hands.  Fatigue.  Restlessness.  Sleep problems.  Enlarged thyroid gland or a lump in  the thyroid (nodule). You may also have symptoms of Graves' disease, which may include:  Protruding eyes.  Dry eyes.  Red or swollen eyes.  Problems with vision. How is this diagnosed? This condition may be diagnosed based on:  Your symptoms and medical history.  A physical exam.  Blood tests.  Thyroid ultrasound. This test involves using sound waves to produce images of the thyroid gland.  A thyroid scan. A radioactive substance is injected into a vein, and images show how much iodine is present in the thyroid.  Radioactive iodine uptake test (RAIU). A small amount of radioactive iodine is given by mouth to see how much iodine the thyroid absorbs after a certain amount of time. How is this treated? Treatment depends on the cause and severity of the condition. Treatment may include:  Medicines to reduce the amount of thyroid hormone your body makes.  Radioactive iodine treatment (radioiodine therapy). This involves swallowing a small dose of radioactive iodine, in capsule or liquid form, to kill thyroid cells.  Surgery to remove part or all of your thyroid gland. You may need to take thyroid hormone replacement medicine for the rest of your life after thyroid surgery.  Medicines to help manage your symptoms. Follow these instructions at home:   Take over-the-counter and prescription medicines only as told by your health care provider.  Do not use any products that contain nicotine or tobacco, such as cigarettes and e-cigarettes. If you need help quitting, ask your health care provider.  Follow any instructions from your health care provider about diet. You may be  instructed to limit foods that contain iodine.  Keep all follow-up visits as told by your health care provider. This is important. ? You will need to have blood tests regularly so that your health care provider can monitor your condition. Contact a health care provider if:  Your symptoms do not get better with  treatment.  You have a fever.  You are taking thyroid hormone replacement medicine and you: ? Have symptoms of depression. ? Feel like you are tired all the time. ? Gain weight. Get help right away if:  You have chest pain.  You have decreased alertness or a change in your awareness.  You have abdominal pain.  You feel dizzy.  You have a rapid heartbeat.  You have an irregular heartbeat.  You have difficulty breathing. Summary  The thyroid gland is a small gland located in the lower front part of the neck, just in front of the windpipe (trachea).  Hyperthyroidism is when the thyroid gland is too active (overactive) and produces too much of a hormone called thyroxine.  The most common cause is Graves' disease, a disorder in which your immune system attacks the thyroid gland.  Hyperthyroidism can cause various symptoms, such as unexplained weight loss, nervousness, inability to tolerate heat, or changes in your heartbeat.  Treatment may include medicine to reduce the amount of thyroid hormone your body makes, radioiodine therapy, surgery, or medicines to manage symptoms. This information is not intended to replace advice given to you by your health care provider. Make sure you discuss any questions you have with your health care provider. Document Released: 03/11/2005 Document Revised: 02/21/2017 Document Reviewed: 02/19/2017 Elsevier Patient Education  2020 [employer].

## 2019-03-01 NOTE — Progress Notes (Signed)
Patient ID: Russell Reynolds, male   DOB: Aug 13, 1995, 23 y.o.   MRN: [id number]   This visit occurred during the SARS-CoV-2 public health emergency.  Safety protocols were in place, including screening questions prior to the visit, additional usage of staff PPE, and extensive cleaning of exam room while observing appropriate contact time as indicated for disinfecting solutions.   HPI  Russell Reynolds is a 23 y.o.-year-old male, referred by his PCP, Russell Katz, NP, for evaluation and management of thyrotoxicosis.  Patient describes that he was found to be thyrotoxic on screening labs in 07/2018. Retrospectively, he describes tremors x4-5 years, palpitations x1 year, anxiety - long standing, on med x 5 years, chest pain occasionally.  He saw Dr. Dorris Fetch 2 months later - note from 10/13/2018 reviewed.  Labs were ordered and patient was supposed to return to have them checked, but he did not do so.  He had a repeat set of labs in 12/2018 by PCP and the TSH has improved but was still low.   At that time, he was on a heart monitor x2 weeks >> HR 90s-120s. He was recommended metoprolol 12.5 mg daily, but he tells me he did not start this.  I reviewed pt's thyroid tests: 01/23/2019: TSH 0.263 (0.45-4.5), free T4 1.51 (0.82-1.77), free T3 3.5 (2-4.1), WBC 4.8 (3.4-10.8). 07/29/2018: TSH 0.19, free T4 1.47, free T3 3.8 No results found for: TSH, FREET4, T3FREE  Antithyroid antibodies: No results found for: TSI  Pt denies: - feeling nodules in neck - hoarseness - dysphagia - choking - SOB with lying down  He denies: - fatigue - excessive sweating/heat intolerance - hyperdefecation - weight loss, but not gaining weight - hair loss  But has: - tremors - anxiety-longstanding - palpitations-every day or every other day - Insomnia-longstanding  Pt does have a FH of thyroid ds.  (Hashimoto's hypothyroidism) in mother. No FH of thyroid cancer. No h/o radiation tx to head or neck.  No seaweed or kelp, no  recent contrast studies. No steroid use. No herbal supplements. No Biotin use.  Pt. also has a history of anxiety for which he is on medications.  ROS: Constitutional: + see HPI Eyes: no blurry vision, no xerophthalmia ENT: no sore throat, + see HPI Cardiovascular: + CP/SOB/+ palpitations/no leg swelling Respiratory: no cough/SOB Gastrointestinal: no N/V/D/C Musculoskeletal: no muscle/joint aches Skin: no rashes Neurological: + tremors/no numbness/tingling/dizziness Psychiatric: no depression/+ anxiety  Past Medical History:  Diagnosis Date  . Renal disorder    kidney stones   No past surgical history on file. Social History   Socioeconomic History  . Marital status: Married    Spouse name: Not on file  . Number of children: 71: 28 and 40-year-old-02/2019  . Years of education: Not on file  . Highest education level: Not on file  Occupational History  .  Programmer, systems  Social Needs  . Financial resource strain: Not on file  . Food insecurity    Worry: Not on file    Inability: Not on file  . Transportation needs    Medical: Not on file    Non-medical: Not on file  Tobacco Use  . Smoking status:  Former smoker, quit in 2019  . Smokeless tobacco: Never Used  Substance and Sexual Activity  . Alcohol use:  Yes, liquor/beer once a week, 3 drinks  . Drug use: No  . Sexual activity: Not on file  Lifestyle  . Physical activity    Days per week: 4x, cardio  Minutes per session: Not on file   Current Outpatient Medications on File Prior to Visit  Medication Sig Dispense Refill  . busPIRone (BUSPAR) 10 MG tablet Take 10 mg by mouth daily as needed.    Marland Kitchen PARoxetine (PAXIL) 10 MG tablet Take 10 mg by mouth at bedtime.  0   No current facility-administered medications on file prior to visit.    Allergies  Allergen Reactions  . Sulfa Antibiotics Anaphylaxis and Swelling   Family History  Problem Relation Age of Onset  . Thyroid disease Mother   Heart disease  in mother.  PE: BP 112/80   Pulse 90   Ht 5\' 4"  (1.626 m)   Wt 113 lb (51.3 kg)   SpO2 98%   BMI 19.40 kg/m  Wt Readings from Last 3 Encounters:  03/01/19 113 lb (51.3 kg)  10/13/18 113 lb (51.3 kg)  12/03/16 120 lb (54.4 kg)   Constitutional: Thin, in NAD Eyes: PERRLA, EOMI, no exophthalmos, no lid lag, no stare ENT: moist mucous membranes, no thyromegaly, no thyroid bruits, no cervical lymphadenopathy Cardiovascular: RRR, No MRG Respiratory: CTA B Gastrointestinal: abdomen soft, NT, ND, BS+ Musculoskeletal: no deformities, strength intact in all 4 Skin: moist, warm, no rashes Neurological: no tremor with outstretched hands, DTR normal in all 4  ASSESSMENT: 1. Thyrotoxicosis  PLAN:  1. Patient with a recently found low TSH, with thyrotoxic sxs: Tremors, palpitations, chest pain, also chronic anxiety and insomnia. - he does not appear to have exogenous causes for the low TSH.  - We discussed that possible causes of thyrotoxicosis are:  02/02/17 Graves ds   . Thyroiditis . toxic multinodular goiter/ toxic adenoma (I cannot feel nodules at palpation of his thyroid). - will check the TSH, fT3 and fT4 and also add thyroid stimulating antibodies to screen for Graves' disease.  - If the tests remain abnormal, we may need an uptake and scan to differentiate between the 3 above possible etiologies  - we discussed about possible modalities of treatment for the above conditions, to include methimazole use, radioactive iodine ablation or (last resort) surgery. - we may need to do thyroid ultrasound depending on the results of the uptake and scan (if a cold nodule is present) - he was recommended to start beta blockers but did not start it yet.  He does have palpitations, fleeting episodes every day or every other day.  At today's visit pulse is 90.  I advised him to check his pulse at home and if higher than 90 or if he continues to have palpitations, to start atenolol 25 mg daily. - no signs  of Graves' ophthalmopathy: he does not have any double vision, blurry vision, eye pain, chemosis. - I advised him to join my chart to communicate easier - RTC in 3 months, but likely sooner for repeat labs  Office Visit on 03/01/2019  Component Date Value Ref Range Status  . TSH 03/01/2019 0.53  0.35 - 4.50 uIU/mL Final  . Free T4 03/01/2019 0.97  0.60 - 1.60 ng/dL Final   Comment: Specimens from patients who are undergoing biotin therapy and /or ingesting biotin supplements may contain high levels of biotin.  The higher biotin concentration in these specimens interferes with this Free T4 assay.  Specimens that contain high levels  of biotin may cause false high results for this Free T4 assay.  Please interpret results in light of the total clinical presentation of the patient.    . T3, Free 03/01/2019 4.1  2.3 -  4.2 pg/mL Final  . TSI 03/01/2019 <89  <140 % baseline Final   Comment: . Thyroid stimulating immunoglobulins (TSI) can engage the TSH receptors resulting in hyperthyroidism in Graves' disease patients. TSI levels can be useful in monitoring the clinical outcome of Graves' disease as well as assessing the potential for hyperthyroidism from maternal-fetal transfer. TSI results greater than or equal to (>=) 140% of the Reference Control are considered positive. Marland Kitchen. NOTE: A serum TSH level greater than 350 micro-International Units/mL can interfere with the TSI bioassay and potentially give false positive results. . Patients who are pregnant and are suspected of having hyperthyroidism should have both TSI and human Chorionic Gonadotropin(hCG) tests measured. A serum hCG level greater than 40,625 mIU/mL can interfere with the TSI bioassay and may give false negative results. In these patients it is recommended that a second TSI be obtained when the hCG concentration falls below 40,625 mIU/mL (usually after approximately 20-weeks gestation)                          . . The  analytical performance characteristics of this assay have been determined by [facility], [place], [place].  The modifications have not been cleared or approved by the FDA.  This assay has been validated pursuant to the CLIA regulations and is used for clinical purposes. .    Labs are all normal, c/w resolved thyroiditis. Will repeat his labs in 1.5-2 months and, if normal, can cancel the next appt.  Carlus Pavlovristina , MD PhD [facility]

## 2019-03-03 LAB — THYROID STIMULATING IMMUNOGLOBULIN: TSI: <89 % baseline (ref <140)

## 2019-03-04 ENCOUNTER — Telehealth: Payer: Self-pay

## 2019-03-04 NOTE — Telephone Encounter (Signed)
Notified patient of message from Dr. Gherghe, patient expressed understanding and agreement. No further questions.  

## 2019-03-04 NOTE — Telephone Encounter (Signed)
-----   Message from Philemon Kingdom, MD sent at 03/03/2019  6:19 PM EST ----- Lenna Sciara, can you please call pt: All thyroid labs are normal, consistent with resolved thyroiditis (thyroid inflammation). Will need to repeat his labs in 1.5-2 months and, if normal, can cancel  the next appt. Labs are in.

## 2019-03-22 ENCOUNTER — Ambulatory Visit: Payer: BC Managed Care – PPO | Attending: Internal Medicine

## 2019-03-22 ENCOUNTER — Other Ambulatory Visit: Payer: Self-pay

## 2019-03-22 DIAGNOSIS — Z20828 Contact with and (suspected) exposure to other viral communicable diseases: Secondary | ICD-10-CM | POA: Diagnosis not present

## 2019-03-22 DIAGNOSIS — Z20822 Contact with and (suspected) exposure to covid-19: Secondary | ICD-10-CM

## 2019-03-23 ENCOUNTER — Telehealth: Payer: Self-pay

## 2019-03-23 NOTE — Telephone Encounter (Signed)
Received call from patient checking Covid results.  Advised no results at this time.   

## 2019-03-24 ENCOUNTER — Telehealth: Payer: Self-pay | Admitting: *Deleted

## 2019-03-24 LAB — NOVEL CORONAVIRUS, NAA: SARS-CoV-2, NAA: NOT DETECTED

## 2019-03-24 NOTE — Telephone Encounter (Signed)
Reviewed negative Covid 19 results with patient. No further questions.  

## 2019-06-11 ENCOUNTER — Ambulatory Visit: Payer: BC Managed Care – PPO | Admitting: Internal Medicine

## 2019-06-11 DIAGNOSIS — Z0289 Encounter for other administrative examinations: Secondary | ICD-10-CM

## 2020-08-10 ENCOUNTER — Emergency Department: Payer: Self-pay

## 2020-08-10 ENCOUNTER — Encounter: Payer: Self-pay | Admitting: Emergency Medicine

## 2020-08-10 ENCOUNTER — Other Ambulatory Visit: Payer: Self-pay

## 2020-08-10 ENCOUNTER — Emergency Department
Admission: EM | Admit: 2020-08-10 | Discharge: 2020-08-10 | Disposition: A | Discharge status: Home / Self Care | Payer: Self-pay | Attending: Emergency Medicine | Admitting: Emergency Medicine

## 2020-08-10 DIAGNOSIS — S0101XA Laceration without foreign body of scalp, initial encounter: Secondary | ICD-10-CM | POA: Insufficient documentation

## 2020-08-10 DIAGNOSIS — S0990XA Unspecified injury of head, initial encounter: Secondary | ICD-10-CM

## 2020-08-10 DIAGNOSIS — Z23 Encounter for immunization: Secondary | ICD-10-CM | POA: Insufficient documentation

## 2020-08-10 DIAGNOSIS — Y990 Civilian activity done for income or pay: Secondary | ICD-10-CM | POA: Insufficient documentation

## 2020-08-10 DIAGNOSIS — F17200 Nicotine dependence, unspecified, uncomplicated: Secondary | ICD-10-CM | POA: Insufficient documentation

## 2020-08-10 DIAGNOSIS — S0181XA Laceration without foreign body of other part of head, initial encounter: Secondary | ICD-10-CM | POA: Insufficient documentation

## 2020-08-10 DIAGNOSIS — W228XXA Striking against or struck by other objects, initial encounter: Secondary | ICD-10-CM | POA: Insufficient documentation

## 2020-08-10 MED ORDER — LIDOCAINE-EPINEPHRINE-TETRACAINE (LET) TOPICAL GEL
3.0000 mL | Freq: Once | TOPICAL | Status: AC
  Administered 2020-08-10: 3 mL via TOPICAL
  Filled 2020-08-10: qty 3

## 2020-08-10 MED ORDER — TETANUS-DIPHTH-ACELL PERTUSSIS 5-2.5-18.5 LF-MCG/0.5 IM SUSY
0.5000 mL | PREFILLED_SYRINGE | Freq: Once | INTRAMUSCULAR | Status: AC
  Administered 2020-08-10: 0.5 mL via INTRAMUSCULAR
  Filled 2020-08-10: qty 0.5

## 2020-08-10 MED ORDER — CEPHALEXIN 500 MG PO CAPS: 500.0000 mg | ORAL_CAPSULE | Freq: Three times a day (TID) | ORAL | 0 refills | Status: AC

## 2020-08-10 NOTE — Discharge Instructions (Addendum)
Take staples and sutures removed in five days.

## 2020-08-10 NOTE — ED Provider Notes (Signed)
[facility]-EMERGENCY DEPARTMENT  ____________________________________________  Time seen: Approximately 6:23 PM  I have reviewed the triage vital signs and the nursing notes.   HISTORY  Chief Complaint Head Injury   Historian Patient    HPI Russell Reynolds is a 25 y.o. male presents to the emergency department after he was struck by a piece of metal at work.  Patient denies loss of consciousness but states that he was initially dizzy and had some nausea.  He denies neck pain.  No numbness or tingling in the upper and lower extremities.  No chest pain, chest tightness or abdominal pain.   Past Medical History:  Diagnosis Date  . Renal disorder    kidney stones     Immunizations up to date:  Yes.     Past Medical History:  Diagnosis Date  . Renal disorder    kidney stones    Patient Active Problem List   Diagnosis Date Noted  . Thyrotoxicosis without thyroid storm 03/01/2019    History reviewed. No pertinent surgical history.  Prior to Admission medications   Medication Sig Start Date End Date Taking? Authorizing Provider  cephALEXin (KEFLEX) 500 MG capsule Take 1 capsule (500 mg total) by mouth 3 (three) times daily for 7 days. 08/10/20 08/17/20 Yes Pia Mau M, PA-C  atenolol (TENORMIN) 25 MG tablet Take 1 tablet (25 mg total) by mouth daily. 03/01/19   Carlus Pavlov, MD  busPIRone (BUSPAR) 10 MG tablet Take 10 mg by mouth daily as needed.    [provider]  PARoxetine (PAXIL) 10 MG tablet Take 10 mg by mouth at bedtime. 11/12/16   [provider]    Allergies Sulfa antibiotics  Family History  Problem Relation Age of Onset  . Thyroid disease Mother     Social History Social History   Tobacco Use  . Smoking status: Current Every Day Smoker  . Smokeless tobacco: Never Used  Vaping Use  . Vaping Use: Never used  Substance Use Topics  . Alcohol use: No  . Drug use: No     Review of Systems  Constitutional: No  fever/chills Eyes:  No discharge ENT: No upper respiratory complaints. Respiratory: no cough. No SOB/ use of accessory muscles to breath Gastrointestinal:   No nausea, no vomiting.  No diarrhea.  No constipation. Musculoskeletal: Negative for musculoskeletal pain. Skin: Patient has scalp laceration and forehead laceration.     ____________________________________________   PHYSICAL EXAM:  VITAL SIGNS: ED Triage Vitals  Enc Vitals Group     BP 08/10/20 1559 139/90     Pulse Rate 08/10/20 1559 89     Resp 08/10/20 1559 18     Temp 08/10/20 1559 98.1 F (36.7 C)     Temp Source 08/10/20 1559 Oral     SpO2 08/10/20 1559 100 %     Weight 08/10/20 1502 112 lb 7 oz (51 kg)     Height 08/10/20 1502 5\' 4"  (1.626 m)     Head Circumference --      Peak Flow --      Pain Score 08/10/20 1501 5     Pain Loc --      Pain Edu? --      Excl. in GC? --      Constitutional: Alert and oriented. Well appearing and in no acute distress. Eyes: Conjunctivae are normal. PERRL. EOMI. Head: Atraumatic. Patient has 1 cm left sided forehead laceration. Patient has 1.5 cm right sided parietal laceration.  ENT:  Nose: No congestion/rhinnorhea.      Mouth/Throat: Mucous membranes are moist.  Neck: No stridor.  No cervical spine tenderness to palpation. Cardiovascular: Normal rate, regular rhythm. Normal S1 and S2.  Good peripheral circulation. Respiratory: Normal respiratory effort without tachypnea or retractions. Lungs CTAB. Good air entry to the bases with no decreased or absent breath sounds Gastrointestinal: Bowel sounds x 4 quadrants. Soft and nontender to palpation. No guarding or rigidity. No distention. Musculoskeletal: Full range of motion to all extremities. No obvious deformities noted Neurologic:  Normal for age. No gross focal neurologic deficits are appreciated.  Skin:  Skin is warm, dry and intact. No rash noted. Psychiatric: Mood and affect are normal for age. Speech and  behavior are normal.   ____________________________________________   LABS (all labs ordered are listed, but only abnormal results are displayed)  Labs Reviewed - No data to display ____________________________________________  EKG   ____________________________________________  RADIOLOGY   CT Head Wo Contrast  Result Date: 08/10/2020 CLINICAL DATA:  Status post trauma. EXAM: CT HEAD WITHOUT CONTRAST TECHNIQUE: Contiguous axial images were obtained from the base of the skull through the vertex without intravenous contrast. COMPARISON:  None. FINDINGS: Brain: No evidence of acute infarction, hemorrhage, hydrocephalus, extra-axial collection or mass lesion/mass effect. Vascular: No hyperdense vessel or unexpected calcification. Skull: Normal. Negative for fracture or focal lesion. Sinuses/Orbits: No acute finding. Other: Mild left frontal scalp soft tissue swelling is seen. IMPRESSION: Mild left frontal scalp soft tissue swelling without an acute osseous abnormality. Electronically Signed   By: Aram Candela M.D.   On: 08/10/2020 18:34    ____________________________________________    PROCEDURES  Procedure(s) performed:     Marland KitchenMarland KitchenLaceration Repair  Date/Time: 08/10/2020 7:13 PM Performed by: Orvil Feil, PA-C Authorized by: Orvil Feil, PA-C   Consent:    Consent obtained:  Verbal   Consent given by:  Patient   Risks discussed:  Infection and pain Universal protocol:    Procedure explained and questions answered to patient or proxy's satisfaction: yes     Patient identity confirmed:  Verbally with patient Anesthesia:    Anesthesia method:  None Laceration details:    Location:  Scalp   Length (cm):  1.5   Depth (mm):  5 Pre-procedure details:    Preparation:  Patient was prepped and draped in usual sterile fashion Exploration:    Limited defect created (wound extended): yes     Contaminated: no   Treatment:    Area cleansed with:  Povidone-iodine    Amount of cleaning:  Standard   Irrigation solution:  Sterile saline   Visualized foreign bodies/material removed: no     Debridement:  Minimal Skin repair:    Repair method:  Staples   Number of staples:  2 Approximation:    Approximation:  Close Repair type:    Repair type:  Simple Post-procedure details:    Dressing:  Open (no dressing)   Procedure completion:  Tolerated well, no immediate complications .Marland KitchenLaceration Repair  Date/Time: 08/10/2020 7:14 PM Performed by: Orvil Feil, PA-C Authorized by: Orvil Feil, PA-C   Consent:    Consent obtained:  Verbal   Consent given by:  Patient   Risks discussed:  Infection and pain Universal protocol:    Procedure explained and questions answered to patient or proxy's satisfaction: yes     Patient identity confirmed:  Verbally with patient Anesthesia:    Anesthesia method:  None Laceration details:    Location:  Face  Face location:  Forehead   Length (cm):  1   Depth (mm):  5 Pre-procedure details:    Preparation:  Patient was prepped and draped in usual sterile fashion Exploration:    Limited defect created (wound extended): no     Contaminated: no   Treatment:    Area cleansed with:  Povidone-iodine   Amount of cleaning:  Standard   Irrigation solution:  Sterile saline   Visualized foreign bodies/material removed: no     Debridement:  None Skin repair:    Repair method:  Sutures   Suture size:  5-0   Suture material:  Nylon   Suture technique:  Simple interrupted   Number of sutures:  3 Approximation:    Approximation:  Close Repair type:    Repair type:  Simple Post-procedure details:    Dressing:  Open (no dressing)   Procedure completion:  Tolerated well, no immediate complications       Medications  lidocaine-EPINEPHrine-tetracaine (LET) topical gel (3 mLs Topical Given 08/10/20 1810)  Tdap (BOOSTRIX) injection 0.5 mL (0.5 mLs Intramuscular Given 08/10/20 1915)      ____________________________________________   INITIAL IMPRESSION / ASSESSMENT AND PLAN / ED COURSE  Pertinent labs & imaging results that were available during my care of the patient were reviewed by me and considered in my medical decision making (see chart for details).      Assessment and Plan:  Facial laceration Scalp laceration 25 year old male presents to the emergency department with a left-sided forehead laceration and a parietal scalp laceration repaired in the emergency department without complication.  Patient was advised to have staples removed by primary care in 5 days.  Patient was advised to have sutures removed by primary care in 5 days.  He was discharged with Keflex.  His tetanus status was updated in the emergency department.  CT head showed no signs of intracranial bleed or skull fracture.  All patient questions were answered.     ____________________________________________  FINAL CLINICAL IMPRESSION(S) / ED DIAGNOSES  Final diagnoses:  Injury of head, initial encounter      NEW MEDICATIONS STARTED DURING THIS VISIT:  ED Discharge Orders         Ordered    cephALEXin (KEFLEX) 500 MG capsule  3 times daily        08/10/20 1910              This chart was dictated using voice recognition software/Dragon. Despite best efforts to proofread, errors can occur which can change the meaning. Any change was purely unintentional.     Orvil Feil, PA-C 08/10/20 Prentice Docker    Phineas Semen, MD 08/10/20 Serena Croissant

## 2020-08-10 NOTE — ED Triage Notes (Signed)
Head laceration -- right top of head.  Hit with a flying piece of metal while at work at [employer] (not in [facility] file).  Bleeding controlled.  No lOC.  DSD placed.

## 2022-11-17 ENCOUNTER — Other Ambulatory Visit: Payer: Self-pay

## 2022-11-17 ENCOUNTER — Emergency Department (HOSPITAL_COMMUNITY)
Admission: EM | Admit: 2022-11-17 | Discharge: 2022-11-17 | Disposition: A | Discharge status: Home / Self Care | Payer: Self-pay | Attending: Emergency Medicine | Admitting: Emergency Medicine

## 2022-11-17 ENCOUNTER — Emergency Department (HOSPITAL_COMMUNITY): Payer: Self-pay

## 2022-11-17 ENCOUNTER — Encounter (HOSPITAL_COMMUNITY): Payer: Self-pay

## 2022-11-17 DIAGNOSIS — N390 Urinary tract infection, site not specified: Secondary | ICD-10-CM | POA: Insufficient documentation

## 2022-11-17 DIAGNOSIS — D72829 Elevated white blood cell count, unspecified: Secondary | ICD-10-CM | POA: Insufficient documentation

## 2022-11-17 DIAGNOSIS — N200 Calculus of kidney: Secondary | ICD-10-CM

## 2022-11-17 DIAGNOSIS — N132 Hydronephrosis with renal and ureteral calculous obstruction: Secondary | ICD-10-CM | POA: Insufficient documentation

## 2022-11-17 LAB — COMPREHENSIVE METABOLIC PANEL
ALT: 19 U/L (ref 0–44)
AST: 25 U/L (ref 15–41)
Albumin: 4.9 g/dL (ref 3.5–5.0)
Alkaline Phosphatase: 91 U/L (ref 38–126)
Anion gap: 13 (ref 5–15)
BUN: 15 mg/dL (ref 6–20)
CO2: 22 mmol/L (ref 22–32)
Calcium: 9.7 mg/dL (ref 8.9–10.3)
Chloride: 98 mmol/L (ref 98–111)
Creatinine, Ser: 1.08 mg/dL (ref 0.61–1.24)
GFR, Estimated: >60 mL/min (ref >60)
Glucose, Bld: 118 mg/dL — ABNORMAL HIGH (ref 70–99)
Potassium: 3.6 mmol/L (ref 3.5–5.1)
Sodium: 133 mmol/L — ABNORMAL LOW (ref 135–145)
Total Bilirubin: 1.2 mg/dL (ref 0.3–1.2)
Total Protein: 8.1 g/dL (ref 6.5–8.1)

## 2022-11-17 LAB — URINALYSIS, ROUTINE W REFLEX MICROSCOPIC
Bilirubin Urine: NEGATIVE
Glucose, UA: NEGATIVE mg/dL
Ketones, ur: 5 mg/dL — AB
Nitrite: NEGATIVE
Protein, ur: 30 mg/dL — AB
RBC / HPF: >50 RBC/hpf (ref 0–5)
Specific Gravity, Urine: 1.014 (ref 1.005–1.030)
pH: 6 (ref 5.0–8.0)

## 2022-11-17 LAB — CBC WITH DIFFERENTIAL/PLATELET
Abs Immature Granulocytes: 0 10*3/uL (ref 0.00–0.07)
Basophils Absolute: 0 10*3/uL (ref 0.0–0.1)
Basophils Relative: 0 %
Eosinophils Absolute: 0 10*3/uL (ref 0.0–0.5)
Eosinophils Relative: 0 %
HCT: 43.6 % (ref 39.0–52.0)
Hemoglobin: 15.7 g/dL (ref 13.0–17.0)
Lymphocytes Relative: 7 %
Lymphs Abs: 0.9 10*3/uL (ref 0.7–4.0)
MCH: 31.3 pg (ref 26.0–34.0)
MCHC: 36 g/dL (ref 30.0–36.0)
MCV: 86.9 fL (ref 80.0–100.0)
Monocytes Absolute: 0.6 10*3/uL (ref 0.1–1.0)
Monocytes Relative: 5 %
Neutro Abs: 11.4 10*3/uL — ABNORMAL HIGH (ref 1.7–7.7)
Neutrophils Relative %: 88 %
Platelets: 198 10*3/uL (ref 150–400)
RBC: 5.02 MIL/uL (ref 4.22–5.81)
RDW: 12.2 % (ref 11.5–15.5)
WBC: 12.9 10*3/uL — ABNORMAL HIGH (ref 4.0–10.5)
nRBC: 0 % (ref 0.0–0.2)

## 2022-11-17 LAB — LIPASE, BLOOD: Lipase: 24 U/L (ref 11–51)

## 2022-11-17 MED ORDER — MORPHINE SULFATE (PF) 4 MG/ML IV SOLN
4.0000 mg | Freq: Once | INTRAVENOUS | Status: AC
  Administered 2022-11-17: 4 mg via INTRAVENOUS
  Filled 2022-11-17: qty 1

## 2022-11-17 MED ORDER — TAMSULOSIN HCL 0.4 MG PO CAPS: 0.4000 mg | ORAL_CAPSULE | Freq: Every day | ORAL | 0 refills | Status: AC

## 2022-11-17 MED ORDER — ONDANSETRON HCL 4 MG PO TABS: 4.0000 mg | ORAL_TABLET | Freq: Three times a day (TID) | ORAL | 0 refills | Status: AC | PRN

## 2022-11-17 MED ORDER — CEPHALEXIN 500 MG PO CAPS: 500.0000 mg | ORAL_CAPSULE | Freq: Four times a day (QID) | ORAL | 0 refills | Status: AC

## 2022-11-17 MED ORDER — OXYCODONE-ACETAMINOPHEN 5-325 MG PO TABS: 1.0000 | ORAL_TABLET | Freq: Four times a day (QID) | ORAL | 0 refills | Status: AC | PRN

## 2022-11-17 MED ORDER — ONDANSETRON HCL 4 MG/2ML IJ SOLN
4.0000 mg | Freq: Once | INTRAMUSCULAR | Status: AC
  Administered 2022-11-17: 4 mg via INTRAVENOUS
  Filled 2022-11-17: qty 2

## 2022-11-17 MED ORDER — SODIUM CHLORIDE 0.9 % IV SOLN
1.0000 g | Freq: Once | INTRAVENOUS | Status: AC
  Administered 2022-11-17: 1 g via INTRAVENOUS
  Filled 2022-11-17: qty 10

## 2022-11-17 NOTE — Discharge Instructions (Addendum)
You have been evaluated for your symptoms.  CT scan shows multiple kidney stone involving both sides of your ureters causing obstruction.  They are also evidence of potential infection in your urine.  Please take medications prescribed.  Use a urine strainer to see if you can capture the stone when you urinate.  If you are unable to urinate return to the ER for further care.  Otherwise call and follow-up closely with urologist for outpatient management.

## 2022-11-17 NOTE — ED Triage Notes (Signed)
Pt states he is having pain in his lower back right side and lower right abdomen. Pt states he feels like he is passing a kidney stone, that he has had them before. Pt states he feels like he constantly has to urinate but is unable to urinate much at a time, burning when urinating.

## 2022-11-17 NOTE — ED Provider Notes (Signed)
[place] EMERGENCY DEPARTMENT AT [facility] Provider Note   CSN: [id number] Arrival date & time: 11/17/22  4259     History  Chief Complaint  Patient presents with   Back Pain    Russell Reynolds is a 27 y.o. male.  The history is provided by the patient and medical records. No language interpreter was used.  Back Pain    27 year old male prior history of renal disorder, kidney stone, presenting with complaint of flank pain.  Patient report he developed pain to his right flank acutely last night.  Pain is sharp stabbing rates 9 Out of 10 radiates towards his right groin and felt similar to prior kidney stone.  He endorsed having some difficulty urinating and noted some trace of blood in his urine.  He endorsed nausea without vomiting.  Endorsing chills without fever.  No chest pain or shortness of breath no penile discharge and denies any recent trauma.  He mentioned he has had history of recurrent kidney stones and usually able to pass on his own.  He did require lithotripsy when he was young.  Home Medications Prior to Admission medications   Medication Sig Start Date End Date Taking? Authorizing Provider  atenolol (TENORMIN) 25 MG tablet Take 1 tablet (25 mg total) by mouth daily. 03/01/19   Carlus Pavlov, MD  busPIRone (BUSPAR) 10 MG tablet Take 10 mg by mouth daily as needed.    [provider]  PARoxetine (PAXIL) 10 MG tablet Take 10 mg by mouth at bedtime. 11/12/16   [provider]      Allergies    Sulfa antibiotics    Review of Systems   Review of Systems  Musculoskeletal:  Positive for back pain.  All other systems reviewed and are negative.   Physical Exam Updated Vital Signs BP 129/65   Pulse 69   Temp 98.2 F (36.8 C) (Oral)   Resp 18   Ht 5\' 4"  (1.626 m)   Wt 57.6 kg   SpO2 99%   BMI 21.80 kg/m  Physical Exam Vitals and nursing note reviewed.  Constitutional:      General: He is not in acute distress.    Appearance:  He is well-developed.  HENT:     Head: Atraumatic.  Eyes:     Conjunctiva/sclera: Conjunctivae normal.  Cardiovascular:     Rate and Rhythm: Normal rate and regular rhythm.     Pulses: Normal pulses.     Heart sounds: Normal heart sounds.  Pulmonary:     Effort: Pulmonary effort is normal.     Breath sounds: Normal breath sounds.  Abdominal:     Tenderness: There is abdominal tenderness (Mild tenderness to right lower quadrant no guarding no rebound tenderness no hernia noted.). There is right CVA tenderness (Mild right CVA tenderness).  Musculoskeletal:     Cervical back: Neck supple.  Skin:    Findings: No rash.  Neurological:     Mental Status: He is alert.     ED Results / Procedures / Treatments   Labs (all labs ordered are listed, but only abnormal results are displayed) Labs Reviewed  URINALYSIS, ROUTINE W REFLEX MICROSCOPIC - Abnormal; Notable for the following components:      Result Value   APPearance HAZY (*)    Hgb urine dipstick MODERATE (*)    Ketones, ur 5 (*)    Protein, ur 30 (*)    Leukocytes,Ua SMALL (*)    Bacteria, UA RARE (*)  All other components within normal limits  CBC WITH DIFFERENTIAL/PLATELET - Abnormal; Notable for the following components:   WBC 12.9 (*)    Neutro Abs 11.4 (*)    All other components within normal limits  COMPREHENSIVE METABOLIC PANEL - Abnormal; Notable for the following components:   Sodium 133 (*)    Glucose, Bld 118 (*)    All other components within normal limits  LIPASE, BLOOD    EKG None  Radiology CT Renal Stone Study  Result Date: 11/17/2022 CLINICAL DATA:  Abdominal/flank pain, stone suspected. Right-sided low back and right lower abdominal pain. History of kidney stones. Urinary frequency and dysuria. EXAM: CT ABDOMEN AND PELVIS WITHOUT CONTRAST TECHNIQUE: Multidetector CT imaging of the abdomen and pelvis was performed following the standard protocol without IV contrast. RADIATION DOSE REDUCTION: This  exam was performed according to the departmental dose-optimization program which includes automated exposure control, adjustment of the mA and/or kV according to patient size and/or use of iterative reconstruction technique. COMPARISON:  CT abdomen and pelvis 12/03/2016 FINDINGS: Lower chest: No acute abnormality. Hepatobiliary: No focal liver abnormality is seen. No gallstones, gallbladder wall thickening, or biliary dilatation. Pancreas: Unremarkable. Spleen: Unremarkable. Adrenals/Urinary Tract: Unremarkable adrenal glands. Bilateral renal calculi with overall mildly decreased stone burden on the right and increased burden on the left compared to the prior CT. The largest renal stones measure 5 mm on the right and 9 mm on the left. There is an 11 mm stone in the proximal left ureter with new left severe hydronephrosis and mild left renal cortical thinning. There are 2 adjacent stones in the distal right ureter, each measuring 3 mm, and there is a 7 mm stone in the posterior aspect of the bladder at the right ureterovesical junction. There is moderate right hydroureteronephrosis with perinephric and periureteral stranding. Bilateral low-density renal lesions have enlarged, measuring up to 5.3 cm on the right and 2.5 cm on the left, compatible with cysts and which require no follow-up imaging. Stomach/Bowel: The stomach is unremarkable. There is no evidence of bowel obstruction or inflammation. The appendix is unremarkable. Vascular/Lymphatic: Normal caliber of the abdominal aorta. No enlarged lymph nodes. Reproductive: Borderline prominent prostate.  Vasectomy. Other: No ascites or pneumoperitoneum. Musculoskeletal: No acute osseous abnormality or suspicious osseous lesion. IMPRESSION: 1. Two 3 mm distal right ureteral stones and a 7 mm stone at the right UVJ with moderate right hydroureteronephrosis. 2. 11 mm proximal left ureteral stone with severe left hydronephrosis. 3. Additional bilateral nonobstructing  nephrolithiasis. Electronically Signed   By: Sebastian Ache M.D.   On: 11/17/2022 12:09    Procedures Procedures    Medications Ordered in ED Medications  cefTRIAXone (ROCEPHIN) 1 g in sodium chloride 0.9 % 100 mL IVPB (1 g Intravenous New Bag/Given 11/17/22 1324)  morphine (PF) 4 MG/ML injection 4 mg (4 mg Intravenous Given 11/17/22 1050)  ondansetron (ZOFRAN) injection 4 mg (4 mg Intravenous Given 11/17/22 1050)    ED Course/ Medical Decision Making/ A&P                                 Medical Decision Making Amount and/or Complexity of Data Reviewed Labs: ordered. Radiology: ordered.  Risk Prescription drug management.   BP 129/65   Pulse 69   Temp 98.2 F (36.8 C) (Oral)   Resp 18   Ht 5\' 4"  (1.626 m)   Wt 57.6 kg   SpO2 99%   BMI  21.80 kg/m   29:20 AM  27 year old male prior history of renal disorder, kidney stone, presenting with complaint of flank pain.  Patient report he developed pain to his right flank acutely last night.  Pain is sharp stabbing rates 9 Out of 10 radiates towards his right groin and felt similar to prior kidney stone.  He endorsed having some difficulty urinating and noted some trace of blood in his urine.  He endorsed nausea without vomiting.  Endorsing chills without fever.  No chest pain or shortness of breath no penile discharge and denies any recent trauma.  He mentioned he has had history of recurrent kidney stones and usually able to pass on his own.  He did require lithotripsy when he was young.  On exam patient is sitting in bed appears slightly uncomfortable but nontoxic.  Heart with normal rate and rhythm, lungs clear to auscultation bilaterally abdomen is soft with some minimal tenderness to right lower quadrant.  Mild right flank tenderness on percussion.  Vital signs overall reassuring no fever no hypoxia.  Since pain felt similar to prior kidney stone, will obtain CT renal study for further assessment.  Will provide patient with  supportive care which includes antiemetic and opiate pain medication.  -Labs ordered, independently viewed and interpreted by me.  Labs remarkable for UA showing moderate Hgb, 30 protein, small leuk and 21-50 WBC.  WBC 12.9 -The patient was maintained on a cardiac monitor.  I personally viewed and interpreted the cardiac monitored which showed an underlying rhythm of: NSR -Imaging independently viewed and interpreted by me and I agree with radiologist's interpretation.  Result remarkable for CT renal stone study showing multiple obstructive kidney stones on both sides with hydronephrosis -This patient presents to the ED for concern of flank pain, this involves an extensive number of treatment options, and is a complaint that carries with it a high risk of complications and morbidity.  The differential diagnosis includes kidney stone, UTI, pyelonephritis, appendicitis, colitis -Co morbidities that complicate the patient evaluation includes hx of Kidney stone -Treatment includes morphine, zofran, rocephin -Reevaluation of the patient after these medicines showed that the patient improved -PCP office notes or outside notes reviewed -Discussion with specialist urologist Dr. Mena Goes who recommend outpt f/u but if pt unable to urinate then to return.   -Escalation to admission/observation considered: patients feels much better, is comfortable with discharge, and will follow up with PCP -Prescription medication considered, patient comfortable with flomax, percocet, zofran, keflex -Social Determinant of Health considered which includes tobacco use         Final Clinical Impression(s) / ED Diagnoses Final diagnoses:  Bilateral kidney stones  Lower urinary tract infectious disease    Rx / DC Orders ED Discharge Orders          Ordered    oxyCODONE-acetaminophen (PERCOCET) 5-325 MG tablet  Every 6 hours PRN        11/17/22 1349    tamsulosin (FLOMAX) 0.4 MG CAPS capsule  Daily         11/17/22 1349    ondansetron (ZOFRAN) 4 MG tablet  Every 8 hours PRN        11/17/22 1349    cephALEXin (KEFLEX) 500 MG capsule  4 times daily        11/17/22 1349              Fayrene Helper, PA-C 11/17/22 1352    Gerhard Munch, MD 11/17/22 1550
